# Patient Record
Sex: Male | Born: 1985 | Race: Asian | Hispanic: No | Marital: Single | Smoking: Never smoker
Health system: Southern US, Community
[De-identification: ages and names within clinical notes are randomized; demographics above are authoritative.]

## PROBLEM LIST (undated history)

## (undated) DIAGNOSIS — J45909 Unspecified asthma, uncomplicated: Secondary | ICD-10-CM

---

## 2012-07-03 ENCOUNTER — Emergency Department (HOSPITAL_COMMUNITY)
Admission: EM | Admit: 2012-07-03 | Discharge: 2012-07-03 | Disposition: A | Discharge status: Home / Self Care | Payer: Self-pay | Attending: Emergency Medicine | Admitting: Emergency Medicine

## 2012-07-03 ENCOUNTER — Encounter (HOSPITAL_COMMUNITY): Payer: Self-pay | Admitting: Emergency Medicine

## 2012-07-03 DIAGNOSIS — J45901 Unspecified asthma with (acute) exacerbation: Secondary | ICD-10-CM | POA: Insufficient documentation

## 2012-07-03 DIAGNOSIS — R062 Wheezing: Secondary | ICD-10-CM | POA: Insufficient documentation

## 2012-07-03 DIAGNOSIS — R0602 Shortness of breath: Secondary | ICD-10-CM | POA: Insufficient documentation

## 2012-07-03 HISTORY — DX: Unspecified asthma, uncomplicated: J45.909

## 2012-07-03 MED ORDER — PREDNISONE 20 MG PO TABS: 40.0000 mg | ORAL_TABLET | Freq: Every day | ORAL | Status: DC

## 2012-07-03 MED ORDER — ALBUTEROL SULFATE HFA 108 (90 BASE) MCG/ACT IN AERS: 2.0000 | INHALATION_SPRAY | RESPIRATORY_TRACT | Status: DC | PRN

## 2012-07-03 MED ORDER — ALBUTEROL SULFATE (5 MG/ML) 0.5% IN NEBU
5.0000 mg | INHALATION_SOLUTION | Freq: Once | RESPIRATORY_TRACT | Status: AC
  Administered 2012-07-03: 5 mg via RESPIRATORY_TRACT
  Filled 2012-07-03: qty 1

## 2012-07-03 MED ORDER — PREDNISONE 20 MG PO TABS: 60.0000 mg | ORAL_TABLET | Freq: Once | ORAL | Status: DC

## 2012-07-03 MED ORDER — ALBUTEROL SULFATE HFA 108 (90 BASE) MCG/ACT IN AERS
2.0000 | INHALATION_SPRAY | Freq: Once | RESPIRATORY_TRACT | Status: DC
  Filled 2012-07-03: qty 6.7

## 2012-07-03 NOTE — ED Provider Notes (Signed)
History     CSN: [id number]  Arrival date & time 07/03/12  4098   First MD Initiated Contact with Patient 07/03/12 [phone]      Chief Complaint  Patient presents with  . Asthma    (Consider location/radiation/quality/duration/timing/severity/associated sxs/prior treatment) HPI  Kiko Ripp is a 27 y.o. male with mild intermittent asthma exacerbation worsening over the last several days. Patient's has not taken any other medications he hasn't had it as he essentially grew out of his asthma in childhood. He does have allergies but taking PO allergy medications. He denies fever, cough, nausea vomiting. Patient does have wheezing associated shortness of breath and clear rhinorrhea.   Past Medical History  Diagnosis Date  . Asthma     History reviewed. No pertinent past surgical history.  No family history on file.  History  Substance Use Topics  . Smoking status: Never Smoker   . Smokeless tobacco: Not on file  . Alcohol Use: Yes      Review of Systems  Constitutional: Negative for fever.  Respiratory: Positive for shortness of breath and wheezing.   Cardiovascular: Negative for chest pain.  Gastrointestinal: Negative for nausea, vomiting, abdominal pain and diarrhea.  All other systems reviewed and are negative.    Allergies  Amoxicillin and Penicillins  Home Medications  No current outpatient prescriptions on file.  BP 112/82  Pulse 68  Temp(Src) 97.1 F (36.2 C)  Resp 20  Ht 5\' 7"  (1.702 m)  Wt 160 lb (72.576 kg)  BMI 25.05 kg/m2  SpO2 99%  Physical Exam  Nursing note and vitals reviewed. Constitutional: He is oriented to person, place, and time. He appears well-developed and well-nourished. No distress.  HENT:  Head: Normocephalic.  Eyes: Conjunctivae and EOM are normal.  Cardiovascular: Normal rate.   Pulmonary/Chest: Effort normal. No stridor. No respiratory distress. He has wheezes. He has no rales. He exhibits no tenderness.  Patient  reclining comfortably, speaking in complete sentences. No stridor, no accessory muscle use, good air movement in all fields. There is diffuse expiratory wheezing.  Abdominal: Soft.  Musculoskeletal: Normal range of motion.  Neurological: He is alert and oriented to person, place, and time.  Psychiatric: He has a normal mood and affect.    ED Course  Procedures (including critical care time)  Labs Reviewed - No data to display No results found.   1. Asthma attack       MDM   Filed Vitals:   07/03/12 0837 07/03/12 0856  BP: 112/82   Pulse: 68   Temp: 97.1 F (36.2 C)   Resp: 20   Height: 5\' 7"  (1.702 m)   Weight: 160 lb (72.576 kg)   SpO2: 99% 98%     Jimmy Plessinger is a 27 y.o. male with asthma exacerbation. Nebulizer, chest x-ray and by mouth prednisone ordered.  Patient declines x-ray and prednisone. After nebulizer treatment he states that he feels much better. Lung sounds show reduce wheezing.  Medications  predniSONE (DELTASONE) tablet 60 mg (not administered)  albuterol (PROVENTIL HFA;VENTOLIN HFA) 108 (90 BASE) MCG/ACT inhaler 2 puff (not administered)  albuterol (PROVENTIL) (5 MG/ML) 0.5% nebulizer solution 5 mg (5 mg Nebulization Given 07/03/12 0853)    Pt is hemodynamically stable, appropriate for, and amenable to discharge at this time. Pt verbalized understanding and agrees with care plan. Outpatient follow-up and specific return precautions discussed.    New Prescriptions   ALBUTEROL (PROVENTIL HFA;VENTOLIN HFA) 108 (90 BASE) MCG/ACT INHALER    Inhale 2 puffs  into the lungs every 2 (two) hours as needed for wheezing or shortness of breath (cough).   PREDNISONE (DELTASONE) 20 MG TABLET    Take 2 tablets (40 mg total) by mouth daily.           Wynetta Emery, PA-C 07/03/12 [phone]

## 2012-07-03 NOTE — ED Notes (Addendum)
Patient refused xray.   Patient states "I feel fine.  I don't need one".   Pisciotta, PA advised.

## 2012-07-03 NOTE — ED Notes (Signed)
Patient states that he hasn't had a flare up in years.  Patient states he had been cleaning a lot yesterday for his son's birthday and he thinks he triggered some wheezing.   Patient states that he thinks the humidity is contributing also.   Patient does have some wheezing.  Patient didn't have an inhaler at home.

## 2012-07-04 NOTE — ED Provider Notes (Signed)
Medical screening examination/treatment/procedure(s) were performed by non-physician practitioner and as supervising physician I was immediately available for consultation/collaboration.  Geoffery Lyons, MD 07/04/12 [phone]

## 2012-10-10 ENCOUNTER — Emergency Department (HOSPITAL_COMMUNITY)
Admission: EM | Admit: 2012-10-10 | Discharge: 2012-10-10 | Disposition: A | Discharge status: Home / Self Care | Payer: Self-pay | Attending: Emergency Medicine | Admitting: Emergency Medicine

## 2012-10-10 ENCOUNTER — Encounter (HOSPITAL_COMMUNITY): Payer: Self-pay | Admitting: Emergency Medicine

## 2012-10-10 DIAGNOSIS — J45901 Unspecified asthma with (acute) exacerbation: Secondary | ICD-10-CM | POA: Insufficient documentation

## 2012-10-10 DIAGNOSIS — Z79899 Other long term (current) drug therapy: Secondary | ICD-10-CM | POA: Insufficient documentation

## 2012-10-10 DIAGNOSIS — R0789 Other chest pain: Secondary | ICD-10-CM | POA: Insufficient documentation

## 2012-10-10 MED ORDER — ALBUTEROL SULFATE (5 MG/ML) 0.5% IN NEBU
5.0000 mg | INHALATION_SOLUTION | Freq: Once | RESPIRATORY_TRACT | Status: AC
  Administered 2012-10-10: 5 mg via RESPIRATORY_TRACT
  Filled 2012-10-10: qty 1

## 2012-10-10 MED ORDER — PREDNISONE 20 MG PO TABS: 60.0000 mg | ORAL_TABLET | Freq: Once | ORAL | Status: DC

## 2012-10-10 MED ORDER — IPRATROPIUM BROMIDE 0.02 % IN SOLN
0.5000 mg | Freq: Once | RESPIRATORY_TRACT | Status: DC
  Filled 2012-10-10: qty 2.5

## 2012-10-10 MED ORDER — ALBUTEROL SULFATE (5 MG/ML) 0.5% IN NEBU
5.0000 mg | INHALATION_SOLUTION | Freq: Once | RESPIRATORY_TRACT | Status: DC
  Filled 2012-10-10: qty 1

## 2012-10-10 NOTE — ED Notes (Signed)
Per PT, pt has not been able to sleep all night because his chest has been very tight, pt states he was dx with asthma last year. Pt states he has his inhaler at home to use however he used it up tonight trying to get some relief. Pt states he could not catch his breath and finally decided to seek medical care. Pt tachycardic upon arrival to department and very SOB. Pt started on an albuterol tx as soon as pt was placed in room. Pt is A&O x4. Pt has expiratory wheezing present bilaterally.

## 2012-10-10 NOTE — ED Notes (Signed)
Pt refused breathing tx. Pt states he is ready to do. PA informed. Pt leaving AMA.

## 2012-10-10 NOTE — ED Provider Notes (Signed)
CSN: [id number]     Arrival date & time 10/10/12  7829 History   First MD Initiated Contact with Patient 10/10/12 [phone]     Chief Complaint  Patient presents with  . Asthma   (Consider location/radiation/quality/duration/timing/severity/associated sxs/prior Treatment) HPI Lawrence Gutierrez is a 27 y.o. male who presents emergency department with complaint of shortness of breath. Patient states his shortness of breath started last night when he was going to bed. States he did not take his albuterol because he ran out. States in the last month, has had to use his inhaler daily. States used to be on other inhalers however, does not have a PCP now and not following up. States his breathing is always worse at night time. States he believes it is due to some mold in his house.  Pt received a neb treatment prior to me seeing him. He states he feels much better.      Past Medical History  Diagnosis Date  . Asthma    History reviewed. No pertinent past surgical history. No family history on file. History  Substance Use Topics  . Smoking status: Never Smoker   . Smokeless tobacco: Not on file  . Alcohol Use: Yes    Review of Systems  Constitutional: Negative for fever and chills.  Respiratory: Positive for chest tightness, shortness of breath and wheezing. Negative for cough.   Neurological: Negative for weakness and numbness.  All other systems reviewed and are negative.    Allergies  Amoxicillin and Penicillins  Home Medications   Current Outpatient Rx  Name  Route  Sig  Dispense  Refill  . albuterol (PROVENTIL HFA;VENTOLIN HFA) 108 (90 BASE) MCG/ACT inhaler   Inhalation   Inhale 2 puffs into the lungs every 2 (two) hours as needed for wheezing or shortness of breath (cough).   1 Inhaler   2    BP 117/91  Pulse 102  Temp(Src) 97.6 F (36.4 C) (Oral)  SpO2 93% Physical Exam  Nursing note and vitals reviewed. Constitutional: He is oriented to person, place, and time. He  appears well-developed and well-nourished. No distress.  HENT:  Head: Normocephalic and atraumatic.  Right Ear: External ear normal.  Left Ear: External ear normal.  Mouth/Throat: Oropharynx is clear and moist.  Clear rhinorrhea, pharynx erythemous, uvula midline  Eyes: Conjunctivae are normal.  Neck: Normal range of motion. Neck supple.  No meningeal signs  Cardiovascular: Normal rate, regular rhythm and normal heart sounds.   Pulmonary/Chest: Effort normal and breath sounds normal. No respiratory distress. He has no wheezes. He has no rales.  Decreased air movement bilaterally  Abdominal: Soft. Bowel sounds are normal. There is no tenderness.  Musculoskeletal: He exhibits no edema and no tenderness.  Lymphadenopathy:    He has no cervical adenopathy.  Neurological: He is alert and oriented to person, place, and time.  Skin: Skin is warm and dry. No erythema.  Psychiatric: He has a normal mood and affect.    ED Course  Procedures (including critical care time) Labs Review Labs Reviewed - No data to display Imaging Review No results found.  MDM   1. Asthma exacerbation     PT seen and examined. Pt with asthma exacerbation. According to RN, was wheezing on initial exam and having difficulty breathing, was given albuterol neb. On my exam, no wheezing heard. He is speaking in full sentences. He is in no distress. Oxygen sat at 93%. Will try another neb with atrovent, and 60mg  of prednisone ordered. Pt  has not had any fever, chill, chest pain. Do not think labs or imaging indicated at this time.   7:28 AM Pt refusing to wait on his 2nd treatment or prednisone. Stated "Lawrence Gutierrez are taking too long, I have things to do, I am fine." Pt will be discharged AMA.  PT states he still has his prescriptions from when he was here 1 months ago for an inhaler and prednisone   Filed Vitals:   10/10/12 0618 10/10/12 0715  BP: 117/91   Pulse: 102 107  Temp: 97.6 F (36.4 C)   TempSrc: Oral    SpO2: 93% 96%      Lottie Mussel, PA-C 10/10/12 1536

## 2012-10-10 NOTE — ED Notes (Signed)
Pt left AMA °

## 2012-10-10 NOTE — ED Notes (Signed)
PA at bedside.

## 2012-10-11 NOTE — ED Provider Notes (Signed)
Medical screening examination/treatment/procedure(s) were performed by non-physician practitioner and as supervising physician I was immediately available for consultation/collaboration.   Kearston Putman David Arlin Sass, MD 10/11/12 0243 

## 2013-09-11 ENCOUNTER — Encounter (HOSPITAL_COMMUNITY): Payer: Self-pay | Admitting: Emergency Medicine

## 2013-09-11 ENCOUNTER — Emergency Department (HOSPITAL_COMMUNITY)
Admission: EM | Admit: 2013-09-11 | Discharge: 2013-09-12 | Disposition: A | Discharge status: Home / Self Care | Payer: Self-pay | Attending: Emergency Medicine | Admitting: Emergency Medicine

## 2013-09-11 DIAGNOSIS — R42 Dizziness and giddiness: Secondary | ICD-10-CM | POA: Insufficient documentation

## 2013-09-11 DIAGNOSIS — Z880 Allergy status to penicillin: Secondary | ICD-10-CM | POA: Insufficient documentation

## 2013-09-11 DIAGNOSIS — Z79899 Other long term (current) drug therapy: Secondary | ICD-10-CM | POA: Insufficient documentation

## 2013-09-11 DIAGNOSIS — J45909 Unspecified asthma, uncomplicated: Secondary | ICD-10-CM | POA: Insufficient documentation

## 2013-09-11 DIAGNOSIS — G44209 Tension-type headache, unspecified, not intractable: Secondary | ICD-10-CM | POA: Insufficient documentation

## 2013-09-11 DIAGNOSIS — R51 Headache: Secondary | ICD-10-CM | POA: Insufficient documentation

## 2013-09-11 MED ORDER — KETOROLAC TROMETHAMINE 60 MG/2ML IM SOLN
60.0000 mg | Freq: Once | INTRAMUSCULAR | Status: AC
  Administered 2013-09-11: 60 mg via INTRAMUSCULAR
  Filled 2013-09-11: qty 2

## 2013-09-11 MED ORDER — DIPHENHYDRAMINE HCL 25 MG PO CAPS
50.0000 mg | ORAL_CAPSULE | Freq: Once | ORAL | Status: AC
  Administered 2013-09-11: 50 mg via ORAL
  Filled 2013-09-11: qty 2

## 2013-09-11 MED ORDER — METOCLOPRAMIDE HCL 10 MG PO TABS
10.0000 mg | ORAL_TABLET | Freq: Once | ORAL | Status: AC
  Administered 2013-09-11: 10 mg via ORAL
  Filled 2013-09-11: qty 1

## 2013-09-11 NOTE — ED Notes (Addendum)
presents with 4 days of intermittent headaches-uncontrolled with motrin at home.  Movement makes headaches worse and causes slight dizziness and nasuea. Denies sensitivity to light and sound, denies rashes, denis vomtiing. Also c/o bodyaches, denies fevers. Afebrile here. Bilateral breath sounds clear, alert,. Oriented,, MAEx4., neurologically intact

## 2013-09-11 NOTE — ED Provider Notes (Signed)
CSN: [id number]     Arrival date & time 09/11/13  1654 History   First MD Initiated Contact with Patient 09/11/13 2125     Chief Complaint  Patient presents with  . Headache     (Consider location/radiation/quality/duration/timing/severity/associated sxs/prior Treatment) HPI Patient is a 28 year old male who presents to the ED tonight complaining of headache. Patient states his headache began gradually 4 days ago, when he woke up. Patient states his headache is a "lingering, constant, annoying" pain and he has some mild nausea associated with it with no vomiting. Patient reports if he "thinks about something nice" it helps alleviate his headache. However if he thinks about his problems, his headache comes back. He reports no other alleviating or aggravating factors. He states he has attempted taking 200 mg of ibuprofen 4 times a day for his pain. 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 Patient denies associated photophobia, phonophobia, visual disturbance, blurred vision, dizziness, weakness, back pain, neck pain, fever  Past Medical History  Diagnosis Date  . Asthma    History reviewed. No pertinent past surgical history. History reviewed. No pertinent family history. History  Substance Use Topics  . Smoking status: Never Smoker   . Smokeless tobacco: Not on file  . Alcohol Use: Yes    Review of Systems  Constitutional: Negative for fever.  HENT: Negative for congestion, dental problem, hearing loss, postnasal drip, rhinorrhea, sore throat and trouble swallowing.   Eyes: Negative for photophobia, pain and visual disturbance.  Respiratory: Negative for shortness of breath.   Cardiovascular: Negative for chest pain.  Gastrointestinal: Negative for nausea, vomiting and abdominal pain.  Genitourinary: Negative for dysuria.  Musculoskeletal: Negative for arthralgias, back pain, gait problem,  neck pain and neck stiffness.  Skin: Negative for rash.  Neurological: Negative for dizziness, weakness and numbness.  Psychiatric/Behavioral: Negative.       Allergies  Amoxicillin and Penicillins  Home Medications   Prior to Admission medications   Medication Sig Start Date End Date Taking? Authorizing Provider  acetaminophen (TYLENOL) 500 MG tablet Take 500 mg by mouth every 6 (six) hours as needed for headache.   Yes Historical Provider, MD  albuterol (PROVENTIL HFA;VENTOLIN HFA) 108 (90 BASE) MCG/ACT inhaler Inhale 2 puffs into the lungs every 2 (two) hours as needed for wheezing or shortness of breath (cough). 07/03/12  Yes Nicole Pisciotta, PA-C  cyclobenzaprine (FLEXERIL) 5 MG tablet Take 5 mg by mouth 3 (three) times daily as needed for muscle spasms.   Yes Historical Provider, MD  ibuprofen (ADVIL,MOTRIN) 200 MG tablet Take 200 mg by mouth every 6 (six) hours as needed for headache.   Yes Historical Provider, MD  ibuprofen (ADVIL,MOTRIN) 800 MG tablet Take 1 tablet (800 mg total) by mouth 3 (three) times daily. 09/12/13   Monte Fantasia, PA-C   BP 114/82  Pulse 83  Temp(Src) 98.2 F (36.8 C) (Oral)  Resp 18  SpO2 96% Physical Exam  Nursing note and vitals reviewed. Constitutional: He is oriented to person, place, and time. He appears well-developed and well-nourished. No distress.  HENT:  Head: Normocephalic and atraumatic.  Mouth/Throat: Oropharynx is clear and moist. No oropharyngeal exudate.  Eyes: Right eye exhibits no discharge. Left eye exhibits no discharge. No scleral icterus.  Neck: Normal range of motion.  Cardiovascular: Normal rate, regular rhythm and normal heart sounds.   No murmur heard. Pulmonary/Chest: Effort normal and  breath sounds normal. No respiratory distress.  Abdominal: Soft. There is no tenderness.  Musculoskeletal: Normal range of motion. He exhibits no edema and no tenderness.  Neurological: He is alert and oriented to person, place, and  time. He has normal strength and normal reflexes. No cranial nerve deficit or sensory deficit. Coordination and gait normal. GCS eye subscore is 4. GCS verbal subscore is 5. GCS motor subscore is 6.  Skin: Skin is warm and dry. No rash noted. He is not diaphoretic.  Psychiatric: He has a normal mood and affect.    ED Course  Procedures (including critical care time) Labs Review Labs Reviewed - No data to display  Imaging Review No results found.   EKG Interpretation None      MDM   Final diagnoses:  Tension headache    27 year old male with past medical history of asthma presenting with 4 days of headache. Patient's description of his headache "annoying, lingering headache" which is alleviated by thinking about something nice, and made worse when he thinks about his problems. Patient's neuro exam benign, patient appears nontoxic, no obvious focal neurologic deficit or meningeal signs. Headache inconsistent with history of thunderclap headache, and it is not consistent with clinical presentation of a migraine. At this time we will attempt headache cocktail of Toradol, Reglan, Benadryl to address patient's headache and nausea.   12:15 AM:  Patient states his pain is gone from a 6/10- to a 3/10. Patient states his headache is still "nagging and annoying". We will try a by mouth hydrocodone. Patient states he feels like he may need his inhaler, and states his inhaler at home has run out. We will provide him with one here.  12:57 AM: Patient is sleeping during, with patient patient says he is "feeling much better"and is ready to go. We'll discharge patient with prescription for Motrin and have him follow up with primary care. We encouraged patient to call or return to the ER should he have any questions or concerns.  Filed Vitals:   09/12/13 0100  BP: 114/82  Pulse: 83  Temp: 98.2 F (36.8 C)  Resp: 18     Signed,  Ladona Mow, PA-C 1:46 AM   This patient seen and discussed  with Dr. Raeford Razor, M.D.  Monte Fantasia, PA-C 09/12/13 [phone]

## 2013-09-12 MED ORDER — HYDROCODONE-ACETAMINOPHEN 5-325 MG PO TABS
1.0000 | ORAL_TABLET | Freq: Once | ORAL | Status: AC
  Administered 2013-09-12: 1 via ORAL
  Filled 2013-09-12: qty 1

## 2013-09-12 MED ORDER — ALBUTEROL SULFATE HFA 108 (90 BASE) MCG/ACT IN AERS
2.0000 | INHALATION_SPRAY | Freq: Once | RESPIRATORY_TRACT | Status: AC
  Administered 2013-09-12: 2 via RESPIRATORY_TRACT
  Filled 2013-09-12: qty 6.7

## 2013-09-12 MED ORDER — IBUPROFEN 800 MG PO TABS
800.0000 mg | ORAL_TABLET | Freq: Three times a day (TID) | ORAL | Status: DC
Start: 2013-09-12 — End: 2014-12-27

## 2013-09-12 NOTE — Discharge Instructions (Signed)

## 2013-09-15 NOTE — ED Provider Notes (Signed)
Medical screening examination/treatment/procedure(s) were performed by non-physician practitioner and as supervising physician I was immediately available for consultation/collaboration.   EKG Interpretation None       Raeford Razor, MD 09/15/13 [phone]

## 2013-11-16 ENCOUNTER — Emergency Department (HOSPITAL_COMMUNITY)
Admission: EM | Admit: 2013-11-16 | Discharge: 2013-11-16 | Disposition: A | Discharge status: Home / Self Care | Payer: Self-pay | Attending: Emergency Medicine | Admitting: Emergency Medicine

## 2013-11-16 ENCOUNTER — Encounter (HOSPITAL_COMMUNITY): Payer: Self-pay | Admitting: *Deleted

## 2013-11-16 DIAGNOSIS — Z880 Allergy status to penicillin: Secondary | ICD-10-CM | POA: Insufficient documentation

## 2013-11-16 DIAGNOSIS — J4541 Moderate persistent asthma with (acute) exacerbation: Secondary | ICD-10-CM | POA: Insufficient documentation

## 2013-11-16 DIAGNOSIS — Z791 Long term (current) use of non-steroidal anti-inflammatories (NSAID): Secondary | ICD-10-CM | POA: Insufficient documentation

## 2013-11-16 DIAGNOSIS — Z79899 Other long term (current) drug therapy: Secondary | ICD-10-CM | POA: Insufficient documentation

## 2013-11-16 MED ORDER — PREDNISONE 20 MG PO TABS
60.0000 mg | ORAL_TABLET | Freq: Once | ORAL | Status: AC
  Administered 2013-11-16: 60 mg via ORAL
  Filled 2013-11-16: qty 3

## 2013-11-16 MED ORDER — IPRATROPIUM-ALBUTEROL 0.5-2.5 (3) MG/3ML IN SOLN: 3.0000 mL | Freq: Once | RESPIRATORY_TRACT | Status: AC

## 2013-11-16 MED ORDER — ALBUTEROL SULFATE (2.5 MG/3ML) 0.083% IN NEBU
5.0000 mg | INHALATION_SOLUTION | Freq: Once | RESPIRATORY_TRACT | Status: AC
  Administered 2013-11-16: 5 mg via RESPIRATORY_TRACT
  Filled 2013-11-16: qty 6

## 2013-11-16 MED ORDER — ALBUTEROL SULFATE HFA 108 (90 BASE) MCG/ACT IN AERS: 2.0000 | INHALATION_SPRAY | Freq: Four times a day (QID) | RESPIRATORY_TRACT | Status: DC | PRN

## 2013-11-16 MED ORDER — PREDNISONE 50 MG PO TABS
50.0000 mg | ORAL_TABLET | Freq: Every day | ORAL | Status: DC
Start: 2013-11-16 — End: 2014-02-23

## 2013-11-16 MED ORDER — IPRATROPIUM-ALBUTEROL 0.5-2.5 (3) MG/3ML IN SOLN
RESPIRATORY_TRACT | Status: AC
  Administered 2013-11-16: 3 mL via RESPIRATORY_TRACT
  Filled 2013-11-16: qty 3

## 2013-11-16 NOTE — Discharge Instructions (Signed)
We saw you in the ER for your asthma related complains. We gave you some breathing treatments in the ER, and seems like your symptoms have improved. Please take albuterol as needed every 4 hours. Please take the medications prescribed. Please refrain from smoking or smoke exposure. Please see a primary care doctor in 1 week. Return to the ER if your symptoms worsen.   Asthma Asthma is a recurring condition in which the airways tighten and narrow. Asthma can make it difficult to breathe. It can cause coughing, wheezing, and shortness of breath. Asthma episodes, also called asthma attacks, range from minor to life-threatening. Asthma cannot be cured, but medicines and lifestyle changes can help control it. CAUSES Asthma is believed to be caused by inherited (genetic) and environmental factors, but its exact cause is unknown. Asthma may be triggered by allergens, lung infections, or irritants in the air. Asthma triggers are different for each person. Common triggers include:   Animal dander.  Dust mites.  Cockroaches.  Pollen from trees or grass.  Mold.  Smoke.  Air pollutants such as dust, household cleaners, hair sprays, aerosol sprays, paint fumes, strong chemicals, or strong odors.  Cold air, weather changes, and winds (which increase molds and pollens in the air).  Strong emotional expressions such as crying or laughing hard.  Stress.  Certain medicines (such as aspirin) or types of drugs (such as beta-blockers).  Sulfites in foods and drinks. Foods and drinks that may contain sulfites include dried fruit, potato chips, and sparkling grape juice.  Infections or inflammatory conditions such as the flu, a cold, or an inflammation of the nasal membranes (rhinitis).  Gastroesophageal reflux disease (GERD).  Exercise or strenuous activity. SYMPTOMS Symptoms may occur immediately after asthma is triggered or many hours later. Symptoms include:  Wheezing.  Excessive  nighttime or early morning coughing.  Frequent or severe coughing with a common cold.  Chest tightness.  Shortness of breath. DIAGNOSIS  The diagnosis of asthma is made by a review of your medical history and a physical exam. Tests may also be performed. These may include:  Lung function studies. These tests show how much air you breathe in and out.  Allergy tests.  Imaging tests such as X-rays. TREATMENT  Asthma cannot be cured, but it can usually be controlled. Treatment involves identifying and avoiding your asthma triggers. It also involves medicines. There are 2 classes of medicine used for asthma treatment:   Controller medicines. These prevent asthma symptoms from occurring. They are usually taken every day.  Reliever or rescue medicines. These quickly relieve asthma symptoms. They are used as needed and provide short-term relief. Your health care provider will help you create an asthma action plan. An asthma action plan is a written plan for managing and treating your asthma attacks. It includes a list of your asthma triggers and how they may be avoided. It also includes information on when medicines should be taken and when their dosage should be changed. An action plan may also involve the use of a device called a peak flow meter. A peak flow meter measures how well the lungs are working. It helps you monitor your condition. HOME CARE INSTRUCTIONS   Take medicines only as directed by your health care provider. Speak with your health care provider if you have questions about how or when to take the medicines.  Use a peak flow meter as directed by your health care provider. Record and keep track of readings.  Understand and use the action  plan to help minimize or stop an asthma attack without needing to seek medical care.  Control your home environment in the following ways to help prevent asthma attacks:  Do not smoke. Avoid being exposed to secondhand smoke.  Change your  heating and air conditioning filter regularly.  Limit your use of fireplaces and wood stoves.  Get rid of pests (such as roaches and mice) and their droppings.  Throw away plants if you see mold on them.  Clean your floors and dust regularly. Use unscented cleaning products.  Try to have someone else vacuum for you regularly. Stay out of rooms while they are being vacuumed and for a short while afterward. If you vacuum, use a dust mask from a hardware store, a double-layered or microfilter vacuum cleaner bag, or a vacuum cleaner with a HEPA filter.  Replace carpet with wood, tile, or vinyl flooring. Carpet can trap dander and dust.  Use allergy-proof pillows, mattress covers, and box spring covers.  Wash bed sheets and blankets every week in hot water and dry them in a dryer.  Use blankets that are made of polyester or cotton.  Clean bathrooms and kitchens with bleach. If possible, have someone repaint the walls in these rooms with mold-resistant paint. Keep out of the rooms that are being cleaned and painted.  Wash hands frequently. SEEK MEDICAL CARE IF:   You have wheezing, shortness of breath, or a cough even if taking medicine to prevent attacks.  The colored mucus you cough up (sputum) is thicker than usual.  Your sputum changes from clear or white to yellow, green, gray, or bloody.  You have any problems that may be related to the medicines you are taking (such as a rash, itching, swelling, or trouble breathing).  You are using a reliever medicine more than 2-3 times per week.  Your peak flow is still at 50-79% of your personal best after following your action plan for 1 hour.  You have a fever. SEEK IMMEDIATE MEDICAL CARE IF:   You seem to be getting worse and are unresponsive to treatment during an asthma attack.  You are short of breath even at rest.  You get short of breath when doing very little physical activity.  You have difficulty eating, drinking, or  talking due to asthma symptoms.  You develop chest pain.  You develop a fast heartbeat.  You have a bluish color to your lips or fingernails.  You are light-headed, dizzy, or faint.  Your peak flow is less than 50% of your personal best. MAKE SURE YOU:   Understand these instructions.  Will watch your condition.  Will get help right away if you are not doing well or get worse. Document Released: 01/01/2005 Document Revised: 05/18/2013 Document Reviewed: 07/31/2012 [facility] Patient Information 2015 [place], [place]. This information is not intended to replace advice given to you by your health care provider. Make sure you discuss any questions you have with your health care provider.  Asthma Attack Prevention Although there is no way to prevent asthma from starting, you can take steps to control the disease and reduce its symptoms. Learn about your asthma and how to control it. Take an active role to control your asthma by working with your health care provider to create and follow an asthma action plan. An asthma action plan guides you in:  Taking your medicines properly.  Avoiding things that set off your asthma or make your asthma worse (asthma triggers).  Tracking your level of asthma  control.  Responding to worsening asthma.  Seeking emergency care when needed. To track your asthma, keep records of your symptoms, check your peak flow number using a handheld device that shows how well air moves out of your lungs (peak flow meter), and get regular asthma checkups.  WHAT ARE SOME WAYS TO PREVENT AN ASTHMA ATTACK?  Take medicines as directed by your health care provider.  Keep track of your asthma symptoms and level of control.  With your health care provider, write a detailed plan for taking medicines and managing an asthma attack. Then be sure to follow your action plan. Asthma is an ongoing condition that needs regular monitoring and treatment.  Identify and avoid asthma  triggers. Many outdoor allergens and irritants (such as pollen, mold, cold air, and air pollution) can trigger asthma attacks. Find out what your asthma triggers are and take steps to avoid them.  Monitor your breathing. Learn to recognize warning signs of an attack, such as coughing, wheezing, or shortness of breath. Your lung function may decrease before you notice any signs or symptoms, so regularly measure and record your peak airflow with a home peak flow meter.  Identify and treat attacks early. If you act quickly, you are less likely to have a severe attack. You will also need less medicine to control your symptoms. When your peak flow measurements decrease and alert you to an upcoming attack, take your medicine as instructed and immediately stop any activity that may have triggered the attack. If your symptoms do not improve, get medical help.  Pay attention to increasing quick-relief inhaler use. If you find yourself relying on your quick-relief inhaler, your asthma is not under control. See your health care provider about adjusting your treatment. WHAT CAN MAKE MY SYMPTOMS WORSE? A number of common things can set off or make your asthma symptoms worse and cause temporary increased inflammation of your airways. Keep track of your asthma symptoms for several weeks, detailing all the environmental and emotional factors that are linked with your asthma. When you have an asthma attack, go back to your asthma diary to see which factor, or combination of factors, might have contributed to it. Once you know what these factors are, you can take steps to control many of them. If you have allergies and asthma, it is important to take asthma prevention steps at home. Minimizing contact with the substance to which you are allergic will help prevent an asthma attack. Some triggers and ways to avoid these triggers are: Animal Dander:  Some people are allergic to the flakes of skin or dried saliva from animals  with fur or feathers.   There is no such thing as a hypoallergenic dog or cat breed. All dogs or cats can cause allergies, even if they don't shed.  Keep these pets out of your home.  If you are not able to keep a pet outdoors, keep the pet out of your bedroom and other sleeping areas at all times, and keep the door closed.  Remove carpets and furniture covered with cloth from your home. If that is not possible, keep the pet away from fabric-covered furniture and carpets. Dust Mites: Many people with asthma are allergic to dust mites. Dust mites are tiny bugs that are found in every home in mattresses, pillows, carpets, fabric-covered furniture, bedcovers, clothes, stuffed toys, and other fabric-covered items.   Cover your mattress in a special dust-proof cover.  Cover your pillow in a special dust-proof cover, or wash the  pillow each week in hot water. Water must be hotter than 130 F (54.4 C) to kill dust mites. Cold or warm water used with detergent and bleach can also be effective.  Wash the sheets and blankets on your bed each week in hot water.  Try not to sleep or lie on cloth-covered cushions.  Call ahead when traveling and ask for a smoke-free hotel room. Bring your own bedding and pillows in case the hotel only supplies feather pillows and down comforters, which may contain dust mites and cause asthma symptoms.  Remove carpets from your bedroom and those laid on concrete, if you can.  Keep stuffed toys out of the bed, or wash the toys weekly in hot water or cooler water with detergent and bleach. Cockroaches: Many people with asthma are allergic to the droppings and remains of cockroaches.   Keep food and garbage in closed containers. Never leave food out.  Use poison baits, traps, powders, gels, or paste (for example, boric acid).  If a spray is used to kill cockroaches, stay out of the room until the odor goes away. Indoor Mold:  Fix leaky faucets, pipes, or other  sources of water that have mold around them.  Clean floors and moldy surfaces with a fungicide or diluted bleach.  Avoid using humidifiers, vaporizers, or swamp coolers. These can spread molds through the air. Pollen and Outdoor Mold:  When pollen or mold spore counts are high, try to keep your windows closed.  Stay indoors with windows closed from late morning to afternoon. Pollen and some mold spore counts are highest at that time.  Ask your health care provider whether you need to take anti-inflammatory medicine or increase your dose of the medicine before your allergy season starts. Other Irritants to Avoid:  Tobacco smoke is an irritant. If you smoke, ask your health care provider how you can quit. Ask family members to quit smoking, too. Do not allow smoking in your home or car.  If possible, do not use a wood-burning stove, kerosene heater, or fireplace. Minimize exposure to all sources of smoke, including incense, candles, fires, and fireworks.  Try to stay away from strong odors and sprays, such as perfume, talcum powder, hair spray, and paints.  Decrease humidity in your home and use an indoor air cleaning device. Reduce indoor humidity to below 60%. Dehumidifiers or central air conditioners can do this.  Decrease house dust exposure by changing furnace and air cooler filters frequently.  Try to have someone else vacuum for you once or twice a week. Stay out of rooms while they are being vacuumed and for a short while afterward.  If you vacuum, use a dust mask from a hardware store, a double-layered or microfilter vacuum cleaner bag, or a vacuum cleaner with a HEPA filter.  Sulfites in foods and beverages can be irritants. Do not drink beer or wine or eat dried fruit, processed potatoes, or shrimp if they cause asthma symptoms.  Cold air can trigger an asthma attack. Cover your nose and mouth with a scarf on cold or windy days.  Several health conditions can make asthma more  difficult to manage, including a runny nose, sinus infections, reflux disease, psychological stress, and sleep apnea. Work with your health care provider to manage these conditions.  Avoid close contact with people who have a respiratory infection such as a cold or the flu, since your asthma symptoms may get worse if you catch the infection. Wash your hands thoroughly after touching  items that may have been handled by people with a respiratory infection.  Get a flu shot every year to protect against the flu virus, which often makes asthma worse for days or weeks. Also get a pneumonia shot if you have not previously had one. Unlike the flu shot, the pneumonia shot does not need to be given yearly. Medicines:  Talk to your health care provider about whether it is safe for you to take aspirin or non-steroidal anti-inflammatory medicines (NSAIDs). In a small number of people with asthma, aspirin and NSAIDs can cause asthma attacks. These medicines must be avoided by people who have known aspirin-sensitive asthma. It is important that people with aspirin-sensitive asthma read labels of all over-the-counter medicines used to treat pain, colds, coughs, and fever.  Beta-blockers and ACE inhibitors are other medicines you should discuss with your health care provider. HOW CAN I FIND OUT WHAT I AM ALLERGIC TO? Ask your asthma health care provider about allergy skin testing or blood testing (the RAST test) to identify the allergens to which you are sensitive. If you are found to have allergies, the most important thing to do is to try to avoid exposure to any allergens that you are sensitive to as much as possible. Other treatments for allergies, such as medicines and allergy shots (immunotherapy) are available.  CAN I EXERCISE? Follow your health care provider's advice regarding asthma treatment before exercising. It is important to maintain a regular exercise program, but vigorous exercise or exercise in cold,  humid, or dry environments can cause asthma attacks, especially for those people who have exercise-induced asthma. Document Released: 12/20/2008 Document Revised: 01/06/2013 Document Reviewed: 07/09/2012 [facility] Patient Information 2015 [place]. This information is not intended to replace advice given to you by your health care provider. Make sure you discuss any questions you have with your health care provider.

## 2013-11-16 NOTE — ED Notes (Signed)
Pt reports having an asthma attack starting around 2100 last night. Pt has run out of his rescue inhaler.

## 2013-11-16 NOTE — ED Notes (Signed)
MD at bedside. 

## 2013-11-17 NOTE — ED Provider Notes (Signed)
CSN: [id number]     Arrival date & time 11/16/13  0410 History   First MD Initiated Contact with Patient 11/16/13 0420     Chief Complaint  Patient presents with  . Asthma     (Consider location/radiation/quality/duration/timing/severity/associated sxs/prior Treatment) HPI Comments: SUBJECTIVE:  Lawrence Gutierrez is a 28 y.o. male seen urgently with exacerbation of asthma for 1 days. Wheezing is described as moderate to severe. Associated symptoms:congestion. Current asthma medications: none. Patient denies smoke cigarettes. \  Patient is a 28 y.o. male presenting with asthma. The history is provided by the patient.  Asthma Associated symptoms include shortness of breath. Pertinent negatives include no chest pain and no abdominal pain.    Past Medical History  Diagnosis Date  . Asthma    History reviewed. No pertinent past surgical history. History reviewed. No pertinent family history. History  Substance Use Topics  . Smoking status: Never Smoker   . Smokeless tobacco: Not on file  . Alcohol Use: Yes    Review of Systems  Constitutional: Negative for activity change and appetite change.  Respiratory: Positive for chest tightness, shortness of breath and wheezing. Negative for cough.   Cardiovascular: Negative for chest pain.  Gastrointestinal: Negative for abdominal pain.  Genitourinary: Negative for dysuria.      Allergies  Amoxicillin and Penicillins  Home Medications   Prior to Admission medications   Medication Sig Start Date End Date Taking? Authorizing Provider  acetaminophen (TYLENOL) 500 MG tablet Take 500 mg by mouth every 6 (six) hours as needed for headache.   Yes Historical Provider, MD  albuterol (PROVENTIL HFA;VENTOLIN HFA) 108 (90 BASE) MCG/ACT inhaler Inhale 2 puffs into the lungs every 2 (two) hours as needed for wheezing or shortness of breath (cough). 07/03/12  Yes Nicole Pisciotta, PA-C  ibuprofen (ADVIL,MOTRIN) 200 MG tablet Take 200 mg by  mouth every 6 (six) hours as needed for headache.   Yes Historical Provider, MD  albuterol (PROVENTIL HFA;VENTOLIN HFA) 108 (90 BASE) MCG/ACT inhaler Inhale 2 puffs into the lungs every 6 (six) hours as needed for wheezing or shortness of breath. 11/16/13   Derwood KaplanAnkit Esteban Kobashigawa, MD  cyclobenzaprine (FLEXERIL) 5 MG tablet Take 5 mg by mouth 3 (three) times daily as needed for muscle spasms.    Historical Provider, MD  ibuprofen (ADVIL,MOTRIN) 800 MG tablet Take 1 tablet (800 mg total) by mouth 3 (three) times daily. 09/12/13   Monte FantasiaJoseph W Mintz, PA-C  predniSONE (DELTASONE) 50 MG tablet Take 1 tablet (50 mg total) by mouth daily. 11/16/13   Benicia Bergevin Rhunette CroftNanavati, MD   BP 115/71 mmHg  Pulse 52  Temp(Src) 98 F (36.7 C) (Oral)  Resp 16  Ht 5\' 6"  (1.676 m)  Wt 153 lb (69.4 kg)  BMI 24.71 kg/m2  SpO2 100% Physical Exam  Constitutional: He is oriented to person, place, and time. He appears well-developed.  HENT:  Head: Normocephalic and atraumatic.  Eyes: Conjunctivae and EOM are normal. Pupils are equal, round, and reactive to light.  Neck: Normal range of motion. Neck supple.  Cardiovascular: Normal rate and regular rhythm.   Pulmonary/Chest: Effort normal. He has wheezes.  Abdominal: Soft. Bowel sounds are normal. He exhibits no distension. There is no tenderness. There is no rebound and no guarding.  Neurological: He is alert and oriented to person, place, and time.  Skin: Skin is warm.  Nursing note and vitals reviewed.   ED Course  Procedures (including critical care time) Labs Review Labs Reviewed - No data to display  Imaging Review No results found.   EKG Interpretation None      MDM   Final diagnoses:  Acute asthma exacerbation, moderate persistent    Pt comes in with acute asthma exacerbation. Out of meds. Responded well in the ER to bronchodilators, and is stable for d/c. Rx written.  Derwood KaplanAnkit Merril Nagy, MD 11/17/13 [phone]

## 2014-02-23 ENCOUNTER — Encounter (HOSPITAL_COMMUNITY): Payer: Self-pay | Admitting: Emergency Medicine

## 2014-02-23 ENCOUNTER — Emergency Department (HOSPITAL_COMMUNITY)
Admission: EM | Admit: 2014-02-23 | Discharge: 2014-02-23 | Disposition: A | Discharge status: Home / Self Care | Payer: Self-pay | Attending: Emergency Medicine | Admitting: Emergency Medicine

## 2014-02-23 DIAGNOSIS — R000 Tachycardia, unspecified: Secondary | ICD-10-CM | POA: Insufficient documentation

## 2014-02-23 DIAGNOSIS — Z7952 Long term (current) use of systemic steroids: Secondary | ICD-10-CM | POA: Insufficient documentation

## 2014-02-23 DIAGNOSIS — Z880 Allergy status to penicillin: Secondary | ICD-10-CM | POA: Insufficient documentation

## 2014-02-23 DIAGNOSIS — Z79899 Other long term (current) drug therapy: Secondary | ICD-10-CM | POA: Insufficient documentation

## 2014-02-23 DIAGNOSIS — J45901 Unspecified asthma with (acute) exacerbation: Secondary | ICD-10-CM | POA: Insufficient documentation

## 2014-02-23 MED ORDER — ALBUTEROL SULFATE HFA 108 (90 BASE) MCG/ACT IN AERS: 2.0000 | INHALATION_SPRAY | RESPIRATORY_TRACT | Status: DC | PRN

## 2014-02-23 MED ORDER — ALBUTEROL SULFATE HFA 108 (90 BASE) MCG/ACT IN AERS
2.0000 | INHALATION_SPRAY | Freq: Once | RESPIRATORY_TRACT | Status: AC
  Administered 2014-02-23: 2 via RESPIRATORY_TRACT
  Filled 2014-02-23: qty 6.7

## 2014-02-23 MED ORDER — PREDNISONE 20 MG PO TABS
60.0000 mg | ORAL_TABLET | Freq: Once | ORAL | Status: AC
  Administered 2014-02-23: 60 mg via ORAL
  Filled 2014-02-23: qty 3

## 2014-02-23 MED ORDER — ALBUTEROL SULFATE (2.5 MG/3ML) 0.083% IN NEBU
5.0000 mg | INHALATION_SOLUTION | Freq: Once | RESPIRATORY_TRACT | Status: AC
  Administered 2014-02-23: 5 mg via RESPIRATORY_TRACT
  Filled 2014-02-23: qty 6

## 2014-02-23 MED ORDER — IPRATROPIUM-ALBUTEROL 0.5-2.5 (3) MG/3ML IN SOLN
3.0000 mL | Freq: Once | RESPIRATORY_TRACT | Status: AC
  Administered 2014-02-23: 3 mL via RESPIRATORY_TRACT

## 2014-02-23 MED ORDER — IPRATROPIUM-ALBUTEROL 0.5-2.5 (3) MG/3ML IN SOLN
RESPIRATORY_TRACT | Status: AC
  Filled 2014-02-23: qty 3

## 2014-02-23 MED ORDER — PREDNISONE 50 MG PO TABS: ORAL_TABLET | ORAL | Status: DC

## 2014-02-23 NOTE — ED Provider Notes (Signed)
CSN: [id number]     Arrival date & time 02/23/14  1351 History   First MD Initiated Contact with Patient 02/23/14 1448     Chief Complaint  Patient presents with  . Asthma     (Consider location/radiation/quality/duration/timing/severity/associated sxs/prior Treatment) HPI   Lawrence Gutierrez is a 29 y.o. male complaining of chest tightness and bronchospasm with wheezing onset 2 days ago. Patient states he's been out of his albuterol inhaler for over a week. He denies cough, fever. States that he thinks his asthma set off by both allergies and patient was cleaning the house yesterday and thinks it may have kicked up a lot of dirt into the air.  Past Medical History  Diagnosis Date  . Asthma    History reviewed. No pertinent past surgical history. History reviewed. No pertinent family history. History  Substance Use Topics  . Smoking status: Never Smoker   . Smokeless tobacco: Not on file  . Alcohol Use: Yes    Review of Systems  10 systems reviewed and found to be negative, except as noted in the HPI.   Allergies  Amoxicillin and Penicillins  Home Medications   Prior to Admission medications   Medication Sig Start Date End Date Taking? Authorizing Provider  acetaminophen (TYLENOL) 500 MG tablet Take 500 mg by mouth every 6 (six) hours as needed for headache.    Historical Provider, MD  albuterol (PROVENTIL HFA;VENTOLIN HFA) 108 (90 BASE) MCG/ACT inhaler Inhale 2 puffs into the lungs every 2 (two) hours as needed for wheezing or shortness of breath (cough). 07/03/12   Aubreanna Percle, PA-C  albuterol (PROVENTIL HFA;VENTOLIN HFA) 108 (90 BASE) MCG/ACT inhaler Inhale 2 puffs into the lungs every 6 (six) hours as needed for wheezing or shortness of breath. 11/16/13   Derwood Kaplan, MD  cyclobenzaprine (FLEXERIL) 5 MG tablet Take 5 mg by mouth 3 (three) times daily as needed for muscle spasms.    Historical Provider, MD  ibuprofen (ADVIL,MOTRIN) 200 MG tablet Take 200 mg by  mouth every 6 (six) hours as needed for headache.    Historical Provider, MD  ibuprofen (ADVIL,MOTRIN) 800 MG tablet Take 1 tablet (800 mg total) by mouth 3 (three) times daily. 09/12/13   Monte Fantasia, PA-C  predniSONE (DELTASONE) 50 MG tablet Take 1 tablet (50 mg total) by mouth daily. 11/16/13   Ankit Nanavati, MD   BP 109/80 mmHg  Pulse 119  Temp(Src) 97.9 F (36.6 C) (Oral)  Resp 18  SpO2 95% Physical Exam  Constitutional: He is oriented to person, place, and time. He appears well-developed and well-nourished. No distress.  HENT:  Head: Normocephalic.  Eyes: Conjunctivae and EOM are normal.  Cardiovascular: Regular rhythm and intact distal pulses.   Mild tachycardia  Pulmonary/Chest: Effort normal. No stridor. No respiratory distress. He has wheezes. He has no rales. He exhibits no tenderness.  No tachypnea, patient is speaking in full sentences, very comfortable. He does have slightly reduced air movement in all fields in addition to trace expiratory wheezing.  Note that this evaluation was after the first nebulizer treatment was given  Abdominal: Soft. There is no tenderness.  Musculoskeletal: Normal range of motion.  Neurological: He is alert and oriented to person, place, and time.  Psychiatric: He has a normal mood and affect.  Nursing note and vitals reviewed.   ED Course  Procedures (including critical care time) Labs Review Labs Reviewed - No data to display  Imaging Review No results found.   EKG Interpretation None  MDM   Final diagnoses:  Asthma exacerbation    Filed Vitals:   02/23/14 1421  BP: 109/80  Pulse: 119  Temp: 97.9 F (36.6 C)  TempSrc: Oral  Resp: 18  SpO2: 95%    Medications  ipratropium-albuterol (DUONEB) 0.5-2.5 (3) MG/3ML nebulizer solution 3 mL (3 mLs Nebulization Given 02/23/14 1425)  predniSONE (DELTASONE) tablet 60 mg (60 mg Oral Given 02/23/14 1505)  albuterol (PROVENTIL HFA;VENTOLIN HFA) 108 (90 BASE) MCG/ACT inhaler 2  puff (2 puffs Inhalation Given 02/23/14 1505)  albuterol (PROVENTIL) (2.5 MG/3ML) 0.083% nebulizer solution 5 mg (5 mg Nebulization Given 02/23/14 1505)    Lawrence Gutierrez is a pleasant 29 y.o. male presenting with asthma exacerbation, is out of his medications. Patient is saturating well on room air, no fever or cough to suggest pneumonia. I've evaluated the patient after his first DuoNeb was given. Pt will be given prednisone and another albuterol nebulizer treatment. After reassessment there is better air movement and improvement in the artery mild wheezing that he had. I will start him on a prednisone burst. Patient will be given a nebulizer to go home with.  Evaluation does not show pathology that would require ongoing emergent intervention or inpatient treatment. Pt is hemodynamically stable and mentating appropriately. Discussed findings and plan with patient/guardian, who agrees with care plan. All questions answered. Return precautions discussed and outpatient follow up given.   New Prescriptions   ALBUTEROL (PROVENTIL HFA;VENTOLIN HFA) 108 (90 BASE) MCG/ACT INHALER    Inhale 2 puffs into the lungs every 4 (four) hours as needed for wheezing or shortness of breath.   PREDNISONE (DELTASONE) 50 MG TABLET    Take 1 tablet daily with breakfast         Wynetta Emeryicole Rayshad Riviello, PA-C 02/23/14 1533  Richardean Canalavid H Yao, MD 02/23/14 [id number]

## 2014-02-23 NOTE — Discharge Instructions (Signed)
Do not hesitate to return to the emergency room for any new, worsening or concerning symptoms. ° °Please obtain primary care using resource guide below. But the minute you were seen in the emergency room and that they will need to obtain records for further outpatient management. ° ° °Asthma °Asthma is a recurring condition in which the airways tighten and narrow. Asthma can make it difficult to breathe. It can cause coughing, wheezing, and shortness of breath. Asthma episodes, also called asthma attacks, range from minor to life-threatening. Asthma cannot be cured, but medicines and lifestyle changes can help control it. °CAUSES °Asthma is believed to be caused by inherited (genetic) and environmental factors, but its exact cause is unknown. Asthma may be triggered by allergens, lung infections, or irritants in the air. Asthma triggers are different for each person. Common triggers include:  °· Animal dander. °· Dust mites. °· Cockroaches. °· Pollen from trees or grass. °· Mold. °· Smoke. °· Air pollutants such as dust, household cleaners, hair sprays, aerosol sprays, paint fumes, strong chemicals, or strong odors. °· Cold air, weather changes, and winds (which increase molds and pollens in the air). °· Strong emotional expressions such as crying or laughing hard. °· Stress. °· Certain medicines (such as aspirin) or types of drugs (such as beta-blockers). °· Sulfites in foods and drinks. Foods and drinks that may contain sulfites include dried fruit, potato chips, and sparkling grape juice. °· Infections or inflammatory conditions such as the flu, a cold, or an inflammation of the nasal membranes (rhinitis). °· Gastroesophageal reflux disease (GERD). °· Exercise or strenuous activity. °SYMPTOMS °Symptoms may occur immediately after asthma is triggered or many hours later. Symptoms include: °· Wheezing. °· Excessive nighttime or early morning coughing. °· Frequent or severe coughing with a common cold. °· Chest  tightness. °· Shortness of breath. °DIAGNOSIS  °The diagnosis of asthma is made by a review of your medical history and a physical exam. Tests may also be performed. These may include: °· Lung function studies. These tests show how much air you breathe in and out. °· Allergy tests. °· Imaging tests such as X-rays. °TREATMENT  °Asthma cannot be cured, but it can usually be controlled. Treatment involves identifying and avoiding your asthma triggers. It also involves medicines. There are 2 classes of medicine used for asthma treatment:  °· Controller medicines. These prevent asthma symptoms from occurring. They are usually taken every day. °· Reliever or rescue medicines. These quickly relieve asthma symptoms. They are used as needed and provide short-term relief. °Your health care provider will help you create an asthma action plan. An asthma action plan is a written plan for managing and treating your asthma attacks. It includes a list of your asthma triggers and how they may be avoided. It also includes information on when medicines should be taken and when their dosage should be changed. An action plan may also involve the use of a device called a peak flow meter. A peak flow meter measures how well the lungs are working. It helps you monitor your condition. °HOME CARE INSTRUCTIONS  °· Take medicines only as directed by your health care provider. Speak with your health care provider if you have questions about how or when to take the medicines. °· Use a peak flow meter as directed by your health care provider. Record and keep track of readings. °· Understand and use the action plan to help minimize or stop an asthma attack without needing to seek medical care. °· Control   your home environment in the following ways to help prevent asthma attacks: °¨ Do not smoke. Avoid being exposed to secondhand smoke. °¨ Change your heating and air conditioning filter regularly. °¨ Limit your use of fireplaces and wood  stoves. °¨ Get rid of pests (such as roaches and mice) and their droppings. °¨ Throw away plants if you see mold on them. °¨ Clean your floors and dust regularly. Use unscented cleaning products. °¨ Try to have someone else vacuum for you regularly. Stay out of rooms while they are being vacuumed and for a short while afterward. If you vacuum, use a dust mask from a hardware store, a double-layered or microfilter vacuum cleaner bag, or a vacuum cleaner with a HEPA filter. °¨ Replace carpet with wood, tile, or vinyl flooring. Carpet can trap dander and dust. °¨ Use allergy-proof pillows, mattress covers, and box spring covers. °¨ Wash bed sheets and blankets every week in hot water and dry them in a dryer. °¨ Use blankets that are made of polyester or cotton. °¨ Clean bathrooms and kitchens with bleach. If possible, have someone repaint the walls in these rooms with mold-resistant paint. Keep out of the rooms that are being cleaned and painted. °¨ Wash hands frequently. °SEEK MEDICAL CARE IF:  °· You have wheezing, shortness of breath, or a cough even if taking medicine to prevent attacks. °· The colored mucus you cough up (sputum) is thicker than usual. °· Your sputum changes from clear or white to yellow, green, gray, or bloody. °· You have any problems that may be related to the medicines you are taking (such as a rash, itching, swelling, or trouble breathing). °· You are using a reliever medicine more than 2-3 times per week. °· Your peak flow is still at 50-79% of your personal best after following your action plan for 1 hour. °· You have a fever. °SEEK IMMEDIATE MEDICAL CARE IF:  °· You seem to be getting worse and are unresponsive to treatment during an asthma attack. °· You are short of breath even at rest. °· You get short of breath when doing very little physical activity. °· You have difficulty eating, drinking, or talking due to asthma symptoms. °· You develop chest pain. °· You develop a fast  heartbeat. °· You have a bluish color to your lips or fingernails. °· You are light-headed, dizzy, or faint. °· Your peak flow is less than 50% of your personal best. °MAKE SURE YOU:  °· Understand these instructions. °· Will watch your condition. °· Will get help right away if you are not doing well or get worse. °Document Released: 01/01/2005 Document Revised: 05/18/2013 Document Reviewed: 07/31/2012 °ExitCare® Patient Information ©2015 [employer]. This information is not intended to replace advice given to you by your health care provider. Make sure you discuss any questions you have with your health care provider. ° ° °Emergency Department Resource Guide °1) Find a Doctor and Pay Out of Pocket °Although you won't have to find out who is covered by your insurance plan, it is a good idea to ask around and get recommendations. You will then need to call the office and see if the doctor you have chosen will accept you as a new patient and what types of options they offer for patients who are self-pay. Some doctors offer discounts or will set up payment plans for their patients who do not have insurance, but you will need to ask so you aren't surprised when you get to your   appointment. ° °2) Contact Your Local Health Department °Not all health departments have doctors that can see patients for sick visits, but many do, so it is worth a call to see if yours does. If you don't know where your local health department is, you can check in your phone book. The CDC also has a tool to help you locate your state's health department, and many state websites also have listings of all of their local health departments. ° °3) Find a Walk-in Clinic °If your illness is not likely to be very severe or complicated, you may want to try a walk in clinic. These are popping up all over the country in pharmacies, drugstores, and shopping centers. They're usually staffed by nurse practitioners or physician assistants that have been  trained to treat common illnesses and complaints. They're usually fairly quick and inexpensive. However, if you have serious medical issues or chronic medical problems, these are probably not your best option. ° °No Primary Care Doctor: °- Call [facility] at  832-8000 - they can help you locate a primary care doctor that  accepts your insurance, provides certain services, etc. °- Physician Referral Service- 1-800-533-3463 ° °Chronic Pain Problems: °Organization         Address  Phone   Notes  °[facility]  (336) 297-2271 Patients need to be referred by their primary care doctor.  ° °Medication Assistance: °Organization         Address  Phone   Notes  °[place] [facility] [address] °[place], [place] [address] (336) 641-8030 --Must be a resident of [place] °-- Must have NO insurance coverage whatsoever (no Medicaid/ Medicare, etc.) °-- The pt. MUST have a primary care doctor that directs their care regularly and follows them in the community °  °[facility]  (866) 331-1348   °[employer]  (888) 892-1162   ° °Agencies that provide inexpensive medical care: °Organization         Address  Phone   Notes  °[facility]  (336) 832-8035   °[facility]    (336) 832-7272   °[facility] [address] °[place], [place] [address] (336) 832-4777   °[facility] [address], °[place] (336) 271-4999   °[facility]    (336) 373-0678   °[facility]    (336) 272-1050   °[facility] ° [address], [place] Phone:  (336) 832-4444, Fax:  (336) 832-4440 Hours of Operation:  9 am - 6 pm, M-F.  Also accepts Medicaid/Medicare and self-pay.  °[facility] ° [address], [place] Phone: (336) [phone], Fax: (336) [phone]. Hours of Operation:  8:30 am - 5:30 pm, M-F.  Also accepts Medicaid and self-pay.   °[facility] [place] [address], [place] Phone: (336) 878-6027   °[facility] [address], [place], [place] (336)723-1848, Ext. 123 Mondays & Thursdays: 7-9 AM.  First 15 patients are seen on a first come, first serve basis. °  ° °Medicaid-accepting [place] Providers: ° °Organization         Address  Phone   Notes  °[facility] [address], [place] (336) 641-2100 Also accepts self-pay patients.  °[facility] [address], [place] ° (336) 856-9996   °[facility] [address], [place] (336) 288-8857   °[facility] [address] [place] [address], [place] (336) 299-7000   °Veita Bland [address], [place]  ° (336) 373-1557 Only accepts [place] Medicaid patients after they have their name applied to their card.  ° °Self-Pay (no insurance) in [place]: ° °Organization         Address  Phone   Notes  °Sickle Cell Patients, [facility] [address], [place] (336) 832-1970   °[facility] [address], [place] (336) 832-4400   °[facility] ° [address],  (336) 992-4800   °[facility]/Dr. Osei-Bonsu ° [address] [place] [address], [place] or [address], [place] (336) [phone] Phone number for both [place] and [place] locations is the same.  °[facility] [address], [place] (336) 299-0000   °[facility] [address] [place] [address], [place] or [address] (336) 852-7530 °(336) 878-2260   °[facility] [address], [place] (336) 350-1642, phone; (336) 294-5005, fax Sees patients 1st and 3rd Saturday of every month.  Must not qualify for public or private insurance (i.e. Medicaid, Medicare, [place] Health Choice, Veterans' Benefits) • Household income should be no  more than 200% of the poverty level •The clinic cannot treat you if you are pregnant or think you are pregnant • Sexually transmitted diseases are not treated at the clinic.  ° ° °Dental Care: °Organization         Address  Phone  Notes  °[facility] [address], [place] (336) 641-6152 Accepts children up to age 21 who are enrolled in Medicaid or [place] Health Choice; pregnant women with a Medicaid card; and children who have applied for Medicaid or [place] Health Choice, but were declined, whose parents can pay a reduced fee at time of service.  °[facility] [place]  [address], [place] (336) 641-7733 Accepts children up to age 21 who are enrolled in Medicaid or [place] Health Choice; pregnant women with a Medicaid card; and children who have applied for Medicaid or [place] Health Choice, but were declined, whose parents can pay a reduced fee at time of service.  °[facility] ° [address],  (336) 641-4533 Patients are seen by appointment only. Walk-ins are not accepted. [facility] will see patients 18 years of age and older. °Monday - Tuesday (8am-5pm) °Most Wednesdays (8:30-5pm) °$30 per visit, cash only  °[facility] ° [address], [place] (336) 641-4533 Patients are seen by appointment only. Walk-ins are not accepted. [facility] will see patients 18 years of age and older. °One Wednesday Evening (Monthly: Volunteer Based).  $30 per visit, cash only  °[facility]  (919) 537-3737 for adults; Children under age 4, call Graduate Pediatric Dentistry at ([phone]) [phone]. Children aged 4-14, please call (919) [phone] to request a pediatric application. ° Dental services are provided in all areas of dental care including fillings, crowns and bridges, complete and partial dentures, implants, gum treatment, root canals,  and extractions. Preventive care is also provided. Treatment is provided to both adults and children. °Patients are selected via a lottery and there is often a waiting list. °  °[facility] [address], °[place] ° (336) 763-8833 [email or web address] °  °[facility] [address], [place], [place] (336)723-1848, Ext. 123 Second and Fourth Thursday of each month, opens at 6:30 AM; Clinic ends at 9 AM.  Patients are seen on a first-come first-served basis, and a limited number are seen during each clinic.  ° °[facility] ° [address], [place], [place] (336) 723-7904   Eligibility Requirements °You must have lived in [place], [place], or [place] counties for at least the last three months. °  You cannot be eligible for state or federal sponsored healthcare insurance, including Veterans Administration, Medicaid, or Medicare. °  You generally cannot be eligible for healthcare insurance through your employer.  °  How to apply: °Eligibility screenings are held every Tuesday and Wednesday afternoon from 1:00 pm until 4:00 pm. You do not need an appointment for the interview!  °[facility] [address], [place], [place] 336-631-2330   °[facility]  336-342-8273   °[facility]  336-703-3100   °[facility]  336-570-6415   ° °Behavioral Health Resources in the Community: °Intensive Outpatient Programs °Organization         Address  Phone  Notes  °[facility] [address], [place], [place] 336-878-6098   °[facility] [address], [place], [place] 336-832-9800   °[facility] [address], [place], [place] ° 336-882-2125   °[facility] [address],  °[place], [place] 1-800-853-5163 or 336-641-4981   °Substance Abuse Resources °Organization         Address  Phone  Notes  °[facility]  336-882-2125    °[facility]  336-784-9470   °[facility]  336-285-9073   °[facility]  336-845-3988   °Residential & Outpatient Substance Abuse Program  1-800-659-3381   °Psychological Services °Organization         Address  Phone  Notes  °[facility]  336- 832-9600   °[facility]  336- 378-7881   °[facility] [address], [place] 1-800-853-5163 or 336-641-4981   ° °Mobile Crisis Teams °Organization         Address  Phone  Notes  °[facility], Mobile Crisis Care Unit  1-877-626-1772   °Assertive °[facility] ° [address] [place], [place] 336-834-9664   °Sharon DeEsch [address] °[place] 336-554-5454   ° °Self-Help/Support Groups °Organization         Address  Phone             Notes  °[facility] - variety of support groups  336- 373-1402 Call for more information  °Narcotics Anonymous (NA), [facility] [address], °[place]  2 meetings at this location  ° °Residential Treatment Programs °Organization         Address  Phone  Notes  °[facility] [address],    °[place]  1-866-801-8205   °[facility] ° [address], [place], [place] 704-293-8524   °[facility] [address], [place] 336-845-3988 Admissions: 8am-3pm M-F  °[facility] [address]    °[place], [place] 336-841-1104   °[facility] [address], [place], [place] 336-379-7146   °[facility] [address]  °[place],  336-285-9073   °  [facility] Intensive Outpatient [address], [place], [place] 336-852-3033   °[facility] [address]  °[place], [place] 1-877-615-2722 or 336-784-9470   °[facility] [address] [place], [place] 336-227-7417 Accepts Medicaid  °[facility] [address]   °[place] 1-800-659-3381 Substance Abuse/Addiction Treatment  ° °[place] Behavioral Health Resources °Organization         Address  Phone  Notes  °[facility]  (888) 581-9988   °Julie Brannon, PhD [address] [place], [place]   (336) 349-5553 or (336) 951-0000   °[facility]   [address] °[place], [place] (336) 349-4454   °[facility] [address], [place], [place] (336) 342-8316 Insurance/Medicaid/sponsorship through [facility]  °[facility] [address]                                    [place], [place] (336) 342-8316 Therapy/tele-psych/case  °[facility] [address]  ° [place], [place] (336) 349-2233    °Dr. Arfeen  (336) 349-4544   °[facility] [place]  [employer] [place] [facility] 1) [address], [place] °2) [address], [place] °3)  [address], [place] (336) 349-3220 °(336) 342-7768 ° °(336) 342-8140   °[place] Child Abuse [facility] (336) 342-1394 or (336) 342-3537 (After Hours)    ° ° ° ° °

## 2014-02-23 NOTE — ED Notes (Signed)
Pt here with asthma sx x 2 days; wheezing noted; pt sts out of meds

## 2014-07-29 ENCOUNTER — Emergency Department (HOSPITAL_COMMUNITY): Payer: Self-pay

## 2014-07-29 ENCOUNTER — Emergency Department (HOSPITAL_COMMUNITY)
Admission: EM | Admit: 2014-07-29 | Discharge: 2014-07-29 | Disposition: A | Discharge status: Home / Self Care | Payer: Self-pay | Attending: Emergency Medicine | Admitting: Emergency Medicine

## 2014-07-29 ENCOUNTER — Encounter (HOSPITAL_COMMUNITY): Payer: Self-pay | Admitting: *Deleted

## 2014-07-29 DIAGNOSIS — Z791 Long term (current) use of non-steroidal anti-inflammatories (NSAID): Secondary | ICD-10-CM | POA: Insufficient documentation

## 2014-07-29 DIAGNOSIS — J45901 Unspecified asthma with (acute) exacerbation: Secondary | ICD-10-CM | POA: Insufficient documentation

## 2014-07-29 DIAGNOSIS — Z79899 Other long term (current) drug therapy: Secondary | ICD-10-CM | POA: Insufficient documentation

## 2014-07-29 DIAGNOSIS — Z880 Allergy status to penicillin: Secondary | ICD-10-CM | POA: Insufficient documentation

## 2014-07-29 MED ORDER — DEXAMETHASONE SODIUM PHOSPHATE 10 MG/ML IJ SOLN
10.0000 mg | Freq: Once | INTRAMUSCULAR | Status: AC
  Administered 2014-07-29: 10 mg via INTRAMUSCULAR
  Filled 2014-07-29: qty 1

## 2014-07-29 MED ORDER — ALBUTEROL SULFATE HFA 108 (90 BASE) MCG/ACT IN AERS: 1.0000 | INHALATION_SPRAY | Freq: Four times a day (QID) | RESPIRATORY_TRACT | Status: DC | PRN

## 2014-07-29 MED ORDER — IPRATROPIUM-ALBUTEROL 0.5-2.5 (3) MG/3ML IN SOLN
3.0000 mL | Freq: Once | RESPIRATORY_TRACT | Status: AC
  Administered 2014-07-29: 3 mL via RESPIRATORY_TRACT
  Filled 2014-07-29: qty 3

## 2014-07-29 MED ORDER — PREDNISONE 10 MG (21) PO TBPK: 10.0000 mg | ORAL_TABLET | Freq: Every day | ORAL | Status: DC

## 2014-07-29 MED ORDER — ALBUTEROL SULFATE HFA 108 (90 BASE) MCG/ACT IN AERS
2.0000 | INHALATION_SPRAY | Freq: Once | RESPIRATORY_TRACT | Status: AC
  Administered 2014-07-29: 2 via RESPIRATORY_TRACT
  Filled 2014-07-29: qty 6.7

## 2014-07-29 NOTE — ED Notes (Signed)
Pt states asthma flare-up since yesterday since he cleaned his house yesterday.  States increased wheezing.  Albuterol inhaler ran out 1 month ago.

## 2014-07-29 NOTE — ED Provider Notes (Signed)
CSN: [id number]     Arrival date & time 07/29/14  0710 History   First MD Initiated Contact with Patient 07/29/14 [phone]     Chief Complaint  Patient presents with  . Asthma     (Consider location/radiation/quality/duration/timing/severity/associated sxs/prior Treatment) HPI   Pt with hx asthma p/w exacerbation of his asthma that began two days ago while cleaning his house.  States he always gets an asthma exacerbation when he is around mold.  Has had chest tightness, SOB, wheezing.  Denies fevers, productive cough, hemoptysis, leg swelling.  States this feels like his typical asthma.  Has been out of his albuterol inhaler x 1 month.  Has had neb treatment in ED and is feeling much better now, no further symptoms.   Past Medical History  Diagnosis Date  . Asthma    History reviewed. No pertinent past surgical history. No family history on file. History  Substance Use Topics  . Smoking status: Never Smoker   . Smokeless tobacco: Not on file  . Alcohol Use: Yes    Review of Systems  All other systems reviewed and are negative.     Allergies  Amoxicillin and Penicillins  Home Medications   Prior to Admission medications   Medication Sig Start Date End Date Taking? Authorizing Provider  acetaminophen (TYLENOL) 500 MG tablet Take 500 mg by mouth every 6 (six) hours as needed for headache.    Historical Provider, MD  albuterol (PROVENTIL HFA;VENTOLIN HFA) 108 (90 BASE) MCG/ACT inhaler Inhale 1-2 puffs into the lungs every 6 (six) hours as needed for wheezing or shortness of breath. 07/29/14   Trixie DredgeEmily Vester Balthazor, PA-C  cyclobenzaprine (FLEXERIL) 5 MG tablet Take 5 mg by mouth 3 (three) times daily as needed for muscle spasms.    Historical Provider, MD  ibuprofen (ADVIL,MOTRIN) 200 MG tablet Take 200 mg by mouth every 6 (six) hours as needed for headache.    Historical Provider, MD  ibuprofen (ADVIL,MOTRIN) 800 MG tablet Take 1 tablet (800 mg total) by mouth 3 (three) times daily. 09/12/13    Ladona MowJoe Mintz, PA-C  predniSONE (STERAPRED UNI-PAK 21 TAB) 10 MG (21) TBPK tablet Take 1 tablet (10 mg total) by mouth daily. Day 1: take 6 tabs.  Day 2: 5 tabs  Day 3: 4 tabs  Day 4: 3 tabs  Day 5: 2 tabs  Day 6: 1 tab 07/29/14   Trixie DredgeEmily Renaye Janicki, PA-C   BP 135/98 mmHg  Pulse 70  Temp(Src) 97.7 F (36.5 C) (Oral)  Resp 18  Ht 5\' 6"  (1.676 m)  Wt 160 lb (72.576 kg)  BMI 25.84 kg/m2  SpO2 97% Physical Exam  Constitutional: He appears well-developed and well-nourished. No distress.  HENT:  Head: Normocephalic and atraumatic.  Neck: Neck supple.  Cardiovascular: Normal rate and regular rhythm.   Pulmonary/Chest: Effort normal and breath sounds normal. No respiratory distress. He has no wheezes. He has no rales.  Musculoskeletal:  LEs without edema or tenderness  Neurological: He is alert. He exhibits normal muscle tone.  Skin: He is not diaphoretic.  Psychiatric: He has a normal mood and affect. His behavior is normal.  Nursing note and vitals reviewed.   ED Course  Procedures (including critical care time) Labs Review Labs Reviewed - No data to display  Imaging Review Dg Chest 2 View  07/29/2014   CLINICAL DATA:  Shortness of breath.  Asthma.  EXAM: CHEST  2 VIEW  COMPARISON:  None.  FINDINGS: Heart size and pulmonary vascularity are normal. Slight  accentuation of the interstitial markings. Increased AP dimension of the chest. No consolidative infiltrates or effusions. Heart size and vascularity are normal. No osseous abnormality.  IMPRESSION: No acute infiltrates. Hyperinflation and accentuated interstitial markings consistent with asthma.   Electronically Signed   By: Francene Boyers M.D.   On: 07/29/2014 08:04     EKG Interpretation None      MDM   Final diagnoses:  Asthma exacerbation    Afebrile, nontoxic patient with asthma p/w his typical asthma symptoms x 3 days.  Better with neb treatment.   D/C home with albuterol, prednisone.   Discussed result, findings, treatment, and  follow up  with patient.  Pt given return precautions.  Pt verbalizes understanding and agrees with plan.         [place], PA-C 07/29/14 [id number]  Purvis Sheffield, MD 07/30/14 [phone]

## 2014-07-29 NOTE — Discharge Instructions (Signed)
Read the information below.  Use the prescribed medication as directed.  Please discuss all new medications with your pharmacist.  You may return to the Emergency Department at any time for worsening condition or any new symptoms that concern you.    If you develop worsening shortness of breath, uncontrolled wheezing, severe chest pain, or fevers despite using tylenol and/or ibuprofen, return for a recheck.     ° ° °Asthma °Asthma is a recurring condition in which the airways tighten and narrow. Asthma can make it difficult to breathe. It can cause coughing, wheezing, and shortness of breath. Asthma episodes, also called asthma attacks, range from minor to life-threatening. Asthma cannot be cured, but medicines and lifestyle changes can help control it. °CAUSES °Asthma is believed to be caused by inherited (genetic) and environmental factors, but its exact cause is unknown. Asthma may be triggered by allergens, lung infections, or irritants in the air. Asthma triggers are different for each person. Common triggers include:  °· Animal dander. °· Dust mites. °· Cockroaches. °· Pollen from trees or grass. °· Mold. °· Smoke. °· Air pollutants such as dust, household cleaners, hair sprays, aerosol sprays, paint fumes, strong chemicals, or strong odors. °· Cold air, weather changes, and winds (which increase molds and pollens in the air). °· Strong emotional expressions such as crying or laughing hard. °· Stress. °· Certain medicines (such as aspirin) or types of drugs (such as beta-blockers). °· Sulfites in foods and drinks. Foods and drinks that may contain sulfites include dried fruit, potato chips, and sparkling grape juice. °· Infections or inflammatory conditions such as the flu, a cold, or an inflammation of the nasal membranes (rhinitis). °· Gastroesophageal reflux disease (GERD). °· Exercise or strenuous activity. °SYMPTOMS °Symptoms may occur immediately after asthma is triggered or many hours later. Symptoms  include: °· Wheezing. °· Excessive nighttime or early morning coughing. °· Frequent or severe coughing with a common cold. °· Chest tightness. °· Shortness of breath. °DIAGNOSIS  °The diagnosis of asthma is made by a review of your medical history and a physical exam. Tests may also be performed. These may include: °· Lung function studies. These tests show how much air you breathe in and out. °· Allergy tests. °· Imaging tests such as X-rays. °TREATMENT  °Asthma cannot be cured, but it can usually be controlled. Treatment involves identifying and avoiding your asthma triggers. It also involves medicines. There are 2 classes of medicine used for asthma treatment:  °· Controller medicines. These prevent asthma symptoms from occurring. They are usually taken every day. °· Reliever or rescue medicines. These quickly relieve asthma symptoms. They are used as needed and provide short-term relief. °Your health care provider will help you create an asthma action plan. An asthma action plan is a written plan for managing and treating your asthma attacks. It includes a list of your asthma triggers and how they may be avoided. It also includes information on when medicines should be taken and when their dosage should be changed. An action plan may also involve the use of a device called a peak flow meter. A peak flow meter measures how well the lungs are working. It helps you monitor your condition. °HOME CARE INSTRUCTIONS  °· Take medicines only as directed by your health care provider. Speak with your health care provider if you have questions about how or when to take the medicines. °· Use a peak flow meter as directed by your health care provider. Record and keep track of   readings. °· Understand and use the action plan to help minimize or stop an asthma attack without needing to seek medical care. °· Control your home environment in the following ways to help prevent asthma attacks: °¨ Do not smoke. Avoid being exposed to  secondhand smoke. °¨ Change your heating and air conditioning filter regularly. °¨ Limit your use of fireplaces and wood stoves. °¨ Get rid of pests (such as roaches and mice) and their droppings. °¨ Throw away plants if you see mold on them. °¨ Clean your floors and dust regularly. Use unscented cleaning products. °¨ Try to have someone else vacuum for you regularly. Stay out of rooms while they are being vacuumed and for a short while afterward. If you vacuum, use a dust mask from a hardware store, a double-layered or microfilter vacuum cleaner bag, or a vacuum cleaner with a HEPA filter. °¨ Replace carpet with wood, tile, or vinyl flooring. Carpet can trap dander and dust. °¨ Use allergy-proof pillows, mattress covers, and box spring covers. °¨ Wash bed sheets and blankets every week in hot water and dry them in a dryer. °¨ Use blankets that are made of polyester or cotton. °¨ Clean bathrooms and kitchens with bleach. If possible, have someone repaint the walls in these rooms with mold-resistant paint. Keep out of the rooms that are being cleaned and painted. °¨ Wash hands frequently. °SEEK MEDICAL CARE IF:  °· You have wheezing, shortness of breath, or a cough even if taking medicine to prevent attacks. °· The colored mucus you cough up (sputum) is thicker than usual. °· Your sputum changes from clear or white to yellow, green, gray, or bloody. °· You have any problems that may be related to the medicines you are taking (such as a rash, itching, swelling, or trouble breathing). °· You are using a reliever medicine more than 2-3 times per week. °· Your peak flow is still at 50-79% of your personal best after following your action plan for 1 hour. °· You have a fever. °SEEK IMMEDIATE MEDICAL CARE IF:  °· You seem to be getting worse and are unresponsive to treatment during an asthma attack. °· You are short of breath even at rest. °· You get short of breath when doing very little physical activity. °· You have  difficulty eating, drinking, or talking due to asthma symptoms. °· You develop chest pain. °· You develop a fast heartbeat. °· You have a bluish color to your lips or fingernails. °· You are light-headed, dizzy, or faint. °· Your peak flow is less than 50% of your personal best. °MAKE SURE YOU:  °· Understand these instructions. °· Will watch your condition. °· Will get help right away if you are not doing well or get worse. °Document Released: 01/01/2005 Document Revised: 05/18/2013 Document Reviewed: 07/31/2012 °[employer]® Patient Information ©2015 [employer]. This information is not intended to replace advice given to you by your health care provider. Make sure you discuss any questions you have with your health care provider. ° °

## 2014-07-29 NOTE — ED Notes (Signed)
Monitoring patient for 15 minutes after IM injection.  Pending no vital sign or changes with the patient, will discharge at 0835.

## 2014-12-04 ENCOUNTER — Emergency Department (HOSPITAL_COMMUNITY)
Admission: EM | Admit: 2014-12-04 | Discharge: 2014-12-04 | Disposition: A | Discharge status: Home / Self Care | Payer: Self-pay | Attending: Emergency Medicine | Admitting: Emergency Medicine

## 2014-12-04 ENCOUNTER — Encounter (HOSPITAL_COMMUNITY): Payer: Self-pay | Admitting: *Deleted

## 2014-12-04 DIAGNOSIS — J45901 Unspecified asthma with (acute) exacerbation: Secondary | ICD-10-CM | POA: Insufficient documentation

## 2014-12-04 DIAGNOSIS — Z880 Allergy status to penicillin: Secondary | ICD-10-CM | POA: Insufficient documentation

## 2014-12-04 DIAGNOSIS — Z7952 Long term (current) use of systemic steroids: Secondary | ICD-10-CM | POA: Insufficient documentation

## 2014-12-04 DIAGNOSIS — Z791 Long term (current) use of non-steroidal anti-inflammatories (NSAID): Secondary | ICD-10-CM | POA: Insufficient documentation

## 2014-12-04 MED ORDER — PREDNISONE 20 MG PO TABS
60.0000 mg | ORAL_TABLET | Freq: Once | ORAL | Status: AC
  Administered 2014-12-04: 60 mg via ORAL
  Filled 2014-12-04: qty 3

## 2014-12-04 MED ORDER — ALBUTEROL SULFATE HFA 108 (90 BASE) MCG/ACT IN AERS
2.0000 | INHALATION_SPRAY | Freq: Once | RESPIRATORY_TRACT | Status: AC
  Administered 2014-12-04: 2 via RESPIRATORY_TRACT
  Filled 2014-12-04: qty 6.7

## 2014-12-04 MED ORDER — ALBUTEROL SULFATE HFA 108 (90 BASE) MCG/ACT IN AERS: 2.0000 | INHALATION_SPRAY | RESPIRATORY_TRACT | Status: DC | PRN

## 2014-12-04 MED ORDER — PREDNISONE 20 MG PO TABS: 40.0000 mg | ORAL_TABLET | Freq: Every day | ORAL | Status: DC

## 2014-12-04 MED ORDER — ALBUTEROL SULFATE (2.5 MG/3ML) 0.083% IN NEBU: 2.5000 mg | INHALATION_SOLUTION | Freq: Four times a day (QID) | RESPIRATORY_TRACT | Status: DC | PRN

## 2014-12-04 MED ORDER — IPRATROPIUM-ALBUTEROL 0.5-2.5 (3) MG/3ML IN SOLN
3.0000 mL | Freq: Once | RESPIRATORY_TRACT | Status: AC
  Administered 2014-12-04: 3 mL via RESPIRATORY_TRACT
  Filled 2014-12-04: qty 3

## 2014-12-04 NOTE — Discharge Instructions (Signed)
Do not hesitate to return to the emergency room for any new, worsening or concerning symptoms. ° °Please obtain primary care using resource guide below. Let them know that you were seen in the emergency room and that they will need to obtain records for further outpatient management. ° ° °Asthma, Adult °Asthma is a recurring condition in which the airways tighten and narrow. Asthma can make it difficult to breathe. It can cause coughing, wheezing, and shortness of breath. Asthma episodes, also called asthma attacks, range from minor to life-threatening. Asthma cannot be cured, but medicines and lifestyle changes can help control it. °CAUSES °Asthma is believed to be caused by inherited (genetic) and environmental factors, but its exact cause is unknown. Asthma may be triggered by allergens, lung infections, or irritants in the air. Asthma triggers are different for each person. Common triggers include:  °· Animal dander. °· Dust mites. °· Cockroaches. °· Pollen from trees or grass. °· Mold. °· Smoke. °· Air pollutants such as dust, household cleaners, hair sprays, aerosol sprays, paint fumes, strong chemicals, or strong odors. °· Cold air, weather changes, and winds (which increase molds and pollens in the air). °· Strong emotional expressions such as crying or laughing hard. °· Stress. °· Certain medicines (such as aspirin) or types of drugs (such as beta-blockers). °· Sulfites in foods and drinks. Foods and drinks that may contain sulfites include dried fruit, potato chips, and sparkling grape juice. °· Infections or inflammatory conditions such as the flu, a cold, or an inflammation of the nasal membranes (rhinitis). °· Gastroesophageal reflux disease (GERD). °· Exercise or strenuous activity. °SYMPTOMS °Symptoms may occur immediately after asthma is triggered or many hours later. Symptoms include: °· Wheezing. °· Excessive nighttime or early morning coughing. °· Frequent or severe coughing with a common  cold. °· Chest tightness. °· Shortness of breath. °DIAGNOSIS  °The diagnosis of asthma is made by a review of your medical history and a physical exam. Tests may also be performed. These may include: °· Lung function studies. These tests show how much air you breathe in and out. °· Allergy tests. °· Imaging tests such as X-rays. °TREATMENT  °Asthma cannot be cured, but it can usually be controlled. Treatment involves identifying and avoiding your asthma triggers. It also involves medicines. There are 2 classes of medicine used for asthma treatment:  °· Controller medicines. These prevent asthma symptoms from occurring. They are usually taken every day. °· Reliever or rescue medicines. These quickly relieve asthma symptoms. They are used as needed and provide short-term relief. °Your health care provider will help you create an asthma action plan. An asthma action plan is a written plan for managing and treating your asthma attacks. It includes a list of your asthma triggers and how they may be avoided. It also includes information on when medicines should be taken and when their dosage should be changed. An action plan may also involve the use of a device called a peak flow meter. A peak flow meter measures how well the lungs are working. It helps you monitor your condition. °HOME CARE INSTRUCTIONS  °· Take medicines only as directed by your health care provider. Speak with your health care provider if you have questions about how or when to take the medicines. °· Use a peak flow meter as directed by your health care provider. Record and keep track of readings. °· Understand and use the action plan to help minimize or stop an asthma attack without needing to seek medical care. °·   Control your home environment in the following ways to help prevent asthma attacks: °¨ Do not smoke. Avoid being exposed to secondhand smoke. °¨ Change your heating and air conditioning filter regularly. °¨ Limit your use of fireplaces and  wood stoves. °¨ Get rid of pests (such as roaches and mice) and their droppings. °¨ Throw away plants if you see mold on them. °¨ Clean your floors and dust regularly. Use unscented cleaning products. °¨ Try to have someone else vacuum for you regularly. Stay out of rooms while they are being vacuumed and for a short while afterward. If you vacuum, use a dust mask from a hardware store, a double-layered or microfilter vacuum cleaner bag, or a vacuum cleaner with a HEPA filter. °¨ Replace carpet with wood, tile, or vinyl flooring. Carpet can trap dander and dust. °¨ Use allergy-proof pillows, mattress covers, and box spring covers. °¨ Wash bed sheets and blankets every week in hot water and dry them in a dryer. °¨ Use blankets that are made of polyester or cotton. °¨ Clean bathrooms and kitchens with bleach. If possible, have someone repaint the walls in these rooms with mold-resistant paint. Keep out of the rooms that are being cleaned and painted. °¨ Wash hands frequently. °SEEK MEDICAL CARE IF:  °· You have wheezing, shortness of breath, or a cough even if taking medicine to prevent attacks. °· The colored mucus you cough up (sputum) is thicker than usual. °· Your sputum changes from clear or white to yellow, green, gray, or bloody. °· You have any problems that may be related to the medicines you are taking (such as a rash, itching, swelling, or trouble breathing). °· You are using a reliever medicine more than 2-3 times per week. °· Your peak flow is still at 50-79% of your personal best after following your action plan for 1 hour. °· You have a fever. °SEEK IMMEDIATE MEDICAL CARE IF:  °· You seem to be getting worse and are unresponsive to treatment during an asthma attack. °· You are short of breath even at rest. °· You get short of breath when doing very little physical activity. °· You have difficulty eating, drinking, or talking due to asthma symptoms. °· You develop chest pain. °· You develop a fast  heartbeat. °· You have a bluish color to your lips or fingernails. °· You are light-headed, dizzy, or faint. °· Your peak flow is less than 50% of your personal best. °  °This information is not intended to replace advice given to you by your health care provider. Make sure you discuss any questions you have with your health care provider. °  °Document Released: 01/01/2005 Document Revised: 09/22/2014 Document Reviewed: 07/31/2012 °Elsevier Interactive Patient Education ©2016 Elsevier Inc[employer] ° ° °Emergency Department Resource Guide °1) Find a Doctor and Pay Out of Pocket °Although you won't have to find out who is covered by your insurance plan, it is a good idea to ask around and get recommendations. You will then need to call the office and see if the doctor you have chosen will accept you as a new patient and what types of options they offer for patients who are self-pay. Some doctors offer discounts or will set up payment plans for their patients who do not have insurance, but you will need to ask so you aren't surprised when you get to your appointment. ° °2) Contact Your Local Health Department °Not all health departments have doctors that can see patients for sick visits, but   many do, so it is worth a call to see if yours does. If you don't know where your local health department is, you can check in your phone book. The CDC also has a tool to help you locate your state's health department, and many state websites also have listings of all of their local health departments. ° °3) Find a Walk-in Clinic °If your illness is not likely to be very severe or complicated, you may want to try a walk in clinic. These are popping up all over the country in pharmacies, drugstores, and shopping centers. They're usually staffed by nurse practitioners or physician assistants that have been trained to treat common illnesses and complaints. They're usually fairly quick and inexpensive. However, if you have serious medical  issues or chronic medical problems, these are probably not your best option. ° °No Primary Care Doctor: °- Call [facility] at  832-8000 - they can help you locate a primary care doctor that  accepts your insurance, provides certain services, etc. °- Physician Referral Service- 1-800-533-3463 ° °Chronic Pain Problems: °Organization         Address  Phone   Notes  °[facility]  (336) 297-2271 Patients need to be referred by their primary care doctor.  ° °Medication Assistance: °Organization         Address  Phone   Notes  °[place] [facility] [address] °[place], [place] [address] (336) 641-8030 --Must be a resident of [place] °-- Must have NO insurance coverage whatsoever (no Medicaid/ Medicare, etc.) °-- The pt. MUST have a primary care doctor that directs their care regularly and follows them in the community °  °[facility]  (866) 331-1348   °[employer]  (888) 892-1162   ° °Agencies that provide inexpensive medical care: °Organization         Address  Phone   Notes  °[facility]  (336) 832-8035   °[facility]    (336) 832-7272   °[facility] [address] °[place], [place] [address] (336) 832-4777   °[facility] [address], °[place] (336) 271-4999   °[facility]    (336) 373-0678   °[facility]    (336) 272-1050   °[facility] ° [address], [place] Phone:  (336) 832-4444, Fax:  (336) 832-4440 Hours of Operation:  9 am - 6 pm, M-F.  Also accepts Medicaid/Medicare and self-pay.  °[facility] ° [address], [place] Phone: (336) [phone], Fax: (336) [phone]. Hours of Operation:  8:30 am - 5:30 pm, M-F.  Also accepts Medicaid and self-pay.  °[facility] [place] [address], [place] Phone: (336) 878-6027   °[facility] [address], [place], [place]  (336)723-1848, Ext. 123 Mondays & Thursdays: 7-9 AM.  First 15 patients are seen on a first come, first serve basis. °  ° °Medicaid-accepting [place] Providers: ° °Organization         Address  Phone   Notes  °[facility] [address], [place] (336) 641-2100 Also accepts self-pay patients.  °[facility] [address], [place] ° (336) 856-9996   °[facility] [address], [place] (336) 288-8857   °[facility] [address] [place] [address], [place] (336) 299-7000   °Veita   Bland [address], [place]  ° (336) 373-1557 Only accepts [place] Access Medicaid patients after they have their name applied to their card.  ° °Self-Pay (no insurance) in [place]: ° °Organization         Address  Phone   Notes  °Sickle Cell Patients, [facility] [address], [place] (336) 832-1970   °[facility] [address], [place] (336) 832-4400   °[facility] ° [address], [place] (336) 992-4800   °[facility]/Dr. Osei-Bonsu ° [address] [place] [address], [place] or [address], [place] (336) [phone] Phone number for both [place] and [place] locations is the same.  °[facility] [address], [place] (336) 299-0000   °[facility] [address] [place] [address], [place] or [address] (336) 852-7530 °(336) 878-2260   °[facility] [address], [place] (336) 350-1642, phone; (336) 294-5005, fax Sees patients 1st and 3rd Saturday of every month.  Must not qualify for public or private insurance (i.e. Medicaid, Medicare, [place] Health Choice, Veterans' Benefits) • Household income should be no more than 200% of the poverty level •The clinic cannot treat you if you are pregnant or think you are pregnant • Sexually transmitted  diseases are not treated at the clinic.  ° ° °Dental Care: °Organization         Address  Phone  Notes  °[facility] [address], [place] (336) 641-6152 Accepts children up to age 21 who are enrolled in Medicaid or [place] Health Choice; pregnant women with a Medicaid card; and children who have applied for Medicaid or [place] Health Choice, but were declined, whose parents can pay a reduced fee at time of service.  °[facility] [place]  [address], [place] (336) 641-7733 Accepts children up to age 21 who are enrolled in Medicaid or [place] Health Choice; pregnant women with a Medicaid card; and children who have applied for Medicaid or [place] Health Choice, but were declined, whose parents can pay a reduced fee at time of service.  °[facility] ° [address], [place] (336) 641-4533 Patients are seen by appointment only. Walk-ins are not accepted. [facility] will see patients 18 years of age and older. °Monday - Tuesday (8am-5pm) °Most Wednesdays (8:30-5pm) °$30 per visit, cash only  °[facility] ° [address], [place] (336) [phone] Patients are seen by appointment only. Walk-ins are not accepted. [facility] will see patients 18 years of age and older. °One Wednesday Evening (Monthly: Volunteer Based).  $30 per visit, cash only  °[facility]  (919) [phone] for adults; Children under age 4, call Graduate Pediatric Dentistry at ([phone]) [phone]. Children aged 4-14, please call ([phone]) [phone] to request a pediatric application. ° Dental services are provided in all areas of dental care including fillings, crowns and bridges, complete and partial dentures, implants, gum treatment, root canals, and extractions. Preventive care is also provided. Treatment is provided to both adults and children. °Patients are selected via a  lottery and there is often a waiting list. °  °[facility] [address], ° ° (336) 763-8833 [email or web address] °  °[facility] [address], [place], [place] (336)723-1848, Ext. 123   Second and Fourth Thursday of each month, opens at 6:30 AM; Clinic ends at 9 AM.  Patients are seen on a first-come first-served basis, and a limited number are seen during each clinic.  ° °[facility] ° [address], [place], [place] (336) 723-7904   Eligibility Requirements °You must have lived in [place], [place], or [place] counties for at least the last three months. °  You cannot be eligible for state or federal sponsored healthcare insurance, including Veterans Administration, Medicaid, or Medicare. °  You generally cannot be eligible for healthcare insurance through your employer.  °  How to apply: °Eligibility screenings are held every Tuesday and Wednesday afternoon from 1:00 pm until 4:00 pm. You do not need an appointment for the interview!  °[facility] [address], [place], [place] 336-631-2330   °[facility]  336-342-8273   °[facility]  336-703-3100   °[facility]  336-570-6415   ° °Behavioral Health Resources in the Community: °Intensive Outpatient Programs °Organization         Address  Phone  Notes  °[facility] [address], [place], [place] 336-878-6098   °[facility] [address], [place], [place] 336-832-9800   °[facility] [address], [place], [place] ° 336-882-2125   °[facility] [address],  °[place], [place] 1-800-853-5163 or 336-641-4981   °Substance Abuse Resources °Organization         Address  Phone  Notes  °[facility]  336-882-2125   °[facility]  336-784-9470   °[facility]  336-285-9073   °[facility]  336-845-3988   °Residential &  Outpatient Substance Abuse Program  1-800-659-3381   °Psychological Services °Organization         Address  Phone  Notes  °[facility]  336- 832-9600   °[facility]  336- 378-7881   °[facility] [address], [place] 1-800-853-5163 or 336-641-4981   ° °Mobile Crisis Teams °Organization         Address  Phone  Notes  °[facility], Mobile Crisis Care Unit  1-877-626-1772   °Assertive °[facility] ° [address] [place], [place] 336-834-9664   °Sharon DeEsch [address] °[place] 336-554-5454   ° °Self-Help/Support Groups °Organization         Address  Phone             Notes  °[facility] - variety of support groups  336- 373-1402 Call for more information  °Narcotics Anonymous (NA), [facility] [address], °[place]  2 meetings at this location  ° °Residential Treatment Programs °Organization         Address  Phone  Notes  °[facility] [address],    °[place]  1-866-801-8205   °[facility] ° [address], [place], [place] 704-293-8524   °[facility] [address], [place] 336-845-3988 Admissions: 8am-3pm M-F  °[facility] [address]    °[place], [place] 336-841-1104   °[facility] [address], [place], [place] 336-379-7146   °[facility] [address]  °[place], [place] 336-285-9073   °[facility] - Intensive Outpatient [address], [place], [place] 336-852-3033   °[facility] [address]  °[place], [place] 1-877-615-2722 or 336-784-9470   °[facility] [address] [place], [place] 336-227-7417 Accepts Medicaid  °[facility] [address]  °[place] 1-800-659-3381 Substance Abuse/Addiction Treatment  ° °[place] Behavioral Health Resources °Organization          Address  Phone  Notes  °[facility]  (888) 581-9988   °Julie Brannon, PhD [address] [place], [place]   (336) 349-5553 or (336) 951-0000   °[facility]   [address] °[place], [place] (336) 349-4454   °[facility] [address], [place], [place] (336) 342-8316 Insurance/Medicaid/sponsorship through [facility]  °[facility] [address]                                    [place], [place] (336) 342-8316 Therapy/tele-psych/case  °[facility] [address]  ° [place], [place] (336) 349-2233    °Dr. Arfeen  (336) 349-4544   °[facility] [place]  [employer] [place] [facility] 1) [address], [place] °2) [address], [place] °3)  [address], [place] (336) 349-3220 °(336) 342-7768 ° °(336) 342-8140   °[place] [facility] (336) 342-1394 or (336) 342-3537 (After Hours)    ° ° ° °

## 2014-12-04 NOTE — ED Notes (Signed)
Pt reports having asthma attack and is out of inhaler. spo2 98% at triage, no acute distress noted at triage.

## 2014-12-04 NOTE — ED Provider Notes (Signed)
CSN: [id number]     Arrival date & time 12/04/14  1233 History  By signing my name below, I, Placido Sou, attest that this documentation has been prepared under the direction and in the presence of [employer], PA-C. Electronically Signed: Placido Sou, ED Scribe. 12/04/2014. 12:57 PM.   Chief Complaint  Patient presents with  . Asthma   The history is provided by the patient. No language interpreter was used.    HPI Comments: Lawrence Gutierrez is a 29 y.o. male with a hx of asthma who presents to the Emergency Department complaining of an asthma exacerbation with onset last night. He notes an associated unproductive cough which began last night further noting that he ran out of his albuterol inhaler and requests a refill as well as a new prescription. He notes a hx of hospitalization for asthma but denies having ever been intubated. He denies fever, chills or other associated symptoms at this time.   Past Medical History  Diagnosis Date  . Asthma    History reviewed. No pertinent past surgical history. History reviewed. No pertinent family history. Social History  Substance Use Topics  . Smoking status: Never Smoker   . Smokeless tobacco: None  . Alcohol Use: Yes    Review of Systems  All other systems reviewed and are negative.  Allergies  Amoxicillin and Penicillins  Home Medications   Prior to Admission medications   Medication Sig Start Date End Date Taking? Authorizing Provider  acetaminophen (TYLENOL) 500 MG tablet Take 500 mg by mouth every 6 (six) hours as needed for headache.    Historical Provider, MD  albuterol (PROVENTIL HFA;VENTOLIN HFA) 108 (90 BASE) MCG/ACT inhaler Inhale 1-2 puffs into the lungs every 6 (six) hours as needed for wheezing or shortness of breath. 07/29/14   Trixie Dredge, PA-C  cyclobenzaprine (FLEXERIL) 5 MG tablet Take 5 mg by mouth 3 (three) times daily as needed for muscle spasms.    Historical Provider, MD  ibuprofen (ADVIL,MOTRIN)  200 MG tablet Take 200 mg by mouth every 6 (six) hours as needed for headache.    Historical Provider, MD  ibuprofen (ADVIL,MOTRIN) 800 MG tablet Take 1 tablet (800 mg total) by mouth 3 (three) times daily. 09/12/13   Ladona Mow, PA-C  predniSONE (STERAPRED UNI-PAK 21 TAB) 10 MG (21) TBPK tablet Take 1 tablet (10 mg total) by mouth daily. Day 1: take 6 tabs.  Day 2: 5 tabs  Day 3: 4 tabs  Day 4: 3 tabs  Day 5: 2 tabs  Day 6: 1 tab 07/29/14   Trixie Dredge, PA-C   BP 128/87 mmHg  Pulse 110  Temp(Src) 98.3 F (36.8 C) (Oral)  Resp 20  SpO2 96% Physical Exam  Constitutional: He is oriented to person, place, and time. He appears well-developed and well-nourished. No distress.  Sitting back comfortable and speaking in complete sentences  HENT:  Head: Normocephalic and atraumatic.  Mouth/Throat: Oropharynx is clear and moist. No oropharyngeal exudate.  Eyes: Conjunctivae are normal.  Neck: Normal range of motion. No JVD present. No tracheal deviation present.  Cardiovascular: Normal rate, regular rhythm and intact distal pulses.   Radial pulse equal bilaterally  Pulmonary/Chest: Effort normal. No stridor. No respiratory distress. He has wheezes. He has no rales. He exhibits no tenderness.  No stridor; moderate bilateral expiratory wheezing with prolonged expiration  Abdominal: Soft. He exhibits no distension and no mass. There is no tenderness. There is no rebound and no guarding.  Musculoskeletal: Normal range of motion.  He exhibits no edema or tenderness.  No calf asymmetry, superficial collaterals, palpable cords, edema, Homans sign negative bilaterally.    Neurological: He is alert and oriented to person, place, and time.  Skin: Skin is warm and dry. He is not diaphoretic.  Psychiatric: He has a normal mood and affect. His behavior is normal.  Nursing note and vitals reviewed.  ED Course  Procedures  DIAGNOSTIC STUDIES: Oxygen Saturation is 9% on RA, normal by my interpretation.     COORDINATION OF CARE: 12:45 PM Pt presents today due to an asthma exacerbation. Discussed treatment plan with pt and pt agreed to plan.   Labs Review Labs Reviewed - No data to display  Imaging Review No results found.   EKG Interpretation None      MDM   Final diagnoses:  Asthma exacerbation    Filed Vitals:   12/04/14 1239 12/04/14 1340  BP: 128/87 120/80  Pulse: 110 108  Temp: 98.3 F (36.8 C) 97.7 F (36.5 C)  TempSrc: Oral   Resp: 20 18  SpO2: 96% 95%    Medications  predniSONE (DELTASONE) tablet 60 mg (60 mg Oral Given 12/04/14 1252)  ipratropium-albuterol (DUONEB) 0.5-2.5 (3) MG/3ML nebulizer solution 3 mL (3 mLs Nebulization Given 12/04/14 1252)  albuterol (PROVENTIL HFA;VENTOLIN HFA) 108 (90 BASE) MCG/ACT inhaler 2 puff (2 puffs Inhalation Given 12/04/14 1325)    Lawrence Gutierrez is 29 y.o. male presenting with asthma exacerbation, no signs of systemic infection. Patient reports significant improvement after first nebulizer, recheck shows significant improvement in wheezing. Patient is requesting discharge. Handwritten prescription for nebulizer and patient encouraged to follow closely with primary care   Evaluation does not show pathology that would require ongoing emergent intervention or inpatient treatment. Pt is hemodynamically stable and mentating appropriately. Discussed findings and plan with patient/guardian, who agrees with care plan. All questions answered. Return precautions discussed and outpatient follow up given.   Discharge Medication List as of 12/04/2014  1:30 PM    START taking these medications   Details  albuterol (PROVENTIL) (2.5 MG/3ML) 0.083% nebulizer solution Take 3 mLs (2.5 mg total) by nebulization every 6 (six) hours as needed for wheezing or shortness of breath., Starting 12/04/2014, Until Discontinued, Print    predniSONE (DELTASONE) 20 MG tablet Take 2 tablets (40 mg total) by mouth daily., Starting 12/04/2014, Until  Discontinued, Print        I personally performed the services described in this documentation, which was scribed in my presence. The recorded information has been reviewed and is accurate.     Wynetta Emeryicole Nuel Dejaynes, PA-C 12/04/14 1618  Vanetta MuldersScott Zackowski, MD 12/05/14 [phone]

## 2014-12-27 ENCOUNTER — Emergency Department (HOSPITAL_COMMUNITY)
Admission: EM | Admit: 2014-12-27 | Discharge: 2014-12-27 | Disposition: A | Discharge status: Home / Self Care | Payer: Self-pay | Attending: Emergency Medicine | Admitting: Emergency Medicine

## 2014-12-27 ENCOUNTER — Encounter (HOSPITAL_COMMUNITY): Payer: Self-pay | Admitting: Emergency Medicine

## 2014-12-27 DIAGNOSIS — J4521 Mild intermittent asthma with (acute) exacerbation: Secondary | ICD-10-CM

## 2014-12-27 DIAGNOSIS — Z79899 Other long term (current) drug therapy: Secondary | ICD-10-CM | POA: Insufficient documentation

## 2014-12-27 DIAGNOSIS — Z880 Allergy status to penicillin: Secondary | ICD-10-CM | POA: Insufficient documentation

## 2014-12-27 MED ORDER — ALBUTEROL SULFATE HFA 108 (90 BASE) MCG/ACT IN AERS
2.0000 | INHALATION_SPRAY | RESPIRATORY_TRACT | Status: DC | PRN
  Administered 2014-12-27: 2 via RESPIRATORY_TRACT
  Filled 2014-12-27: qty 6.7

## 2014-12-27 MED ORDER — IPRATROPIUM-ALBUTEROL 0.5-2.5 (3) MG/3ML IN SOLN
3.0000 mL | Freq: Once | RESPIRATORY_TRACT | Status: AC
  Administered 2014-12-27: 3 mL via RESPIRATORY_TRACT
  Filled 2014-12-27: qty 3

## 2014-12-27 MED ORDER — ALBUTEROL SULFATE (2.5 MG/3ML) 0.083% IN NEBU: 5.0000 mg | INHALATION_SOLUTION | Freq: Once | RESPIRATORY_TRACT | Status: DC

## 2014-12-27 MED ORDER — PREDNISONE 20 MG PO TABS
60.0000 mg | ORAL_TABLET | Freq: Once | ORAL | Status: AC
  Administered 2014-12-27: 60 mg via ORAL
  Filled 2014-12-27: qty 3

## 2014-12-27 NOTE — Discharge Instructions (Signed)
Asthma, Adult Asthma is a recurring condition in which the airways tighten and narrow. Asthma can make it difficult to breathe. It can cause coughing, wheezing, and shortness of breath. Asthma episodes, also called asthma attacks, range from minor to life-threatening. Asthma cannot be cured, but medicines and lifestyle changes can help control it. CAUSES Asthma is believed to be caused by inherited (genetic) and environmental factors, but its exact cause is unknown. Asthma may be triggered by allergens, lung infections, or irritants in the air. Asthma triggers are different for each person. Common triggers include:   Animal dander.  Dust mites.  Cockroaches.  Pollen from trees or grass.  Mold.  Smoke.  Air pollutants such as dust, household cleaners, hair sprays, aerosol sprays, paint fumes, strong chemicals, or strong odors.  Cold air, weather changes, and winds (which increase molds and pollens in the air).  Strong emotional expressions such as crying or laughing hard.  Stress.  Certain medicines (such as aspirin) or types of drugs (such as beta-blockers).  Sulfites in foods and drinks. Foods and drinks that may contain sulfites include dried fruit, potato chips, and sparkling grape juice.  Infections or inflammatory conditions such as the flu, a cold, or an inflammation of the nasal membranes (rhinitis).  Gastroesophageal reflux disease (GERD).  Exercise or strenuous activity. SYMPTOMS Symptoms may occur immediately after asthma is triggered or many hours later. Symptoms include:  Wheezing.  Excessive nighttime or early morning coughing.  Frequent or severe coughing with a common cold.  Chest tightness.  Shortness of breath. DIAGNOSIS  The diagnosis of asthma is made by a review of your medical history and a physical exam. Tests may also be performed. These may include:  Lung function studies. These tests show how much air you breathe in and out.  Allergy  tests.  Imaging tests such as X-rays. TREATMENT  Asthma cannot be cured, but it can usually be controlled. Treatment involves identifying and avoiding your asthma triggers. It also involves medicines. There are 2 classes of medicine used for asthma treatment:   Controller medicines. These prevent asthma symptoms from occurring. They are usually taken every day.  Reliever or rescue medicines. These quickly relieve asthma symptoms. They are used as needed and provide short-term relief. Your health care provider will help you create an asthma action plan. An asthma action plan is a written plan for managing and treating your asthma attacks. It includes a list of your asthma triggers and how they may be avoided. It also includes information on when medicines should be taken and when their dosage should be changed. An action plan may also involve the use of a device called a peak flow meter. A peak flow meter measures how well the lungs are working. It helps you monitor your condition. HOME CARE INSTRUCTIONS   Take medicines only as directed by your health care provider. Speak with your health care provider if you have questions about how or when to take the medicines.  Use a peak flow meter as directed by your health care provider. Record and keep track of readings.  Understand and use the action plan to help minimize or stop an asthma attack without needing to seek medical care.  Control your home environment in the following ways to help prevent asthma attacks:  Do not smoke. Avoid being exposed to secondhand smoke.  Change your heating and air conditioning filter regularly.  Limit your use of fireplaces and wood stoves.  Get rid of pests (such as roaches   and mice) and their droppings.  Throw away plants if you see mold on them.  Clean your floors and dust regularly. Use unscented cleaning products.  Try to have someone else vacuum for you regularly. Stay out of rooms while they are  being vacuumed and for a short while afterward. If you vacuum, use a dust mask from a hardware store, a double-layered or microfilter vacuum cleaner bag, or a vacuum cleaner with a HEPA filter.  Replace carpet with wood, tile, or vinyl flooring. Carpet can trap dander and dust.  Use allergy-proof pillows, mattress covers, and box spring covers.  Wash bed sheets and blankets every week in hot water and dry them in a dryer.  Use blankets that are made of polyester or cotton.  Clean bathrooms and kitchens with bleach. If possible, have someone repaint the walls in these rooms with mold-resistant paint. Keep out of the rooms that are being cleaned and painted.  Wash hands frequently. SEEK MEDICAL CARE IF:   You have wheezing, shortness of breath, or a cough even if taking medicine to prevent attacks.  The colored mucus you cough up (sputum) is thicker than usual.  Your sputum changes from clear or white to yellow, green, gray, or bloody.  You have any problems that may be related to the medicines you are taking (such as a rash, itching, swelling, or trouble breathing).  You are using a reliever medicine more than 2-3 times per week.  Your peak flow is still at 50-79% of your personal best after following your action plan for 1 hour.  You have a fever. SEEK IMMEDIATE MEDICAL CARE IF:   You seem to be getting worse and are unresponsive to treatment during an asthma attack.  You are short of breath even at rest.  You get short of breath when doing very little physical activity.  You have difficulty eating, drinking, or talking due to asthma symptoms.  You develop chest pain.  You develop a fast heartbeat.  You have a bluish color to your lips or fingernails.  You are light-headed, dizzy, or faint.  Your peak flow is less than 50% of your personal best.   This information is not intended to replace advice given to you by your health care provider. Make sure you discuss any  questions you have with your health care provider.   Document Released: 01/01/2005 Document Revised: 09/22/2014 Document Reviewed: 07/31/2012 Elsevier Interactive Patient Education 2016 [employer]  

## 2014-12-27 NOTE — ED Notes (Signed)
Pt in from home reporting asthma exacerbation, has albuterol prescription just has not picked it up yet. Reports chest tightness, audible exp wheezing noted.

## 2014-12-27 NOTE — ED Provider Notes (Signed)
CSN: [id number]     Arrival date & time 12/27/14  0450 History   First MD Initiated Contact with Patient 12/27/14 0456     Chief Complaint  Patient presents with  . Asthma     (Consider location/radiation/quality/duration/timing/severity/associated sxs/prior Treatment) HPI Comments: Patient presents to the emergency department for evaluation of difficulty breathing. Patient reports that he does have a history of asthma. He started having difficulty breathing with wheezing tonight. He has run out of his albuterol. He denies any fever, cough or upper respiratory infection symptoms, but he does report that his son has a cold currently. There is no chest pain.  Patient is a 29 y.o. male presenting with asthma.  Asthma Associated symptoms include shortness of breath.    Past Medical History  Diagnosis Date  . Asthma    History reviewed. No pertinent past surgical history. No family history on file. Social History  Substance Use Topics  . Smoking status: Never Smoker   . Smokeless tobacco: None  . Alcohol Use: Yes    Review of Systems  Respiratory: Positive for shortness of breath and wheezing.   All other systems reviewed and are negative.     Allergies  Amoxicillin and Penicillins  Home Medications   Prior to Admission medications   Medication Sig Start Date End Date Taking? Authorizing Provider  acetaminophen (TYLENOL) 500 MG tablet Take 500 mg by mouth every 6 (six) hours as needed for headache.   Yes Historical Provider, MD  albuterol (PROVENTIL HFA;VENTOLIN HFA) 108 (90 BASE) MCG/ACT inhaler Inhale 2 puffs into the lungs every 4 (four) hours as needed for wheezing or shortness of breath. 12/04/14  Yes Nicole Pisciotta, PA-C  ibuprofen (ADVIL,MOTRIN) 200 MG tablet Take 200 mg by mouth every 6 (six) hours as needed for headache.   Yes Historical Provider, MD  albuterol (PROVENTIL) (2.5 MG/3ML) 0.083% nebulizer solution Take 3 mLs (2.5 mg total) by nebulization every 6  (six) hours as needed for wheezing or shortness of breath. Patient not taking: Reported on 12/27/2014 12/04/14   Joni Reining Pisciotta, PA-C   BP 128/84 mmHg  Pulse 92  Temp(Src) 97.5 F (36.4 C) (Oral)  Resp 21  Ht  (1.676 m)  Wt 160 lb (72.576 kg)  BMI 25.84 kg/m2  SpO2 94% Physical Exam  Constitutional: He is oriented to person, place, and time. He appears well-developed and well-nourished. No distress.  HENT:  Head: Normocephalic and atraumatic.  Right Ear: Hearing normal.  Left Ear: Hearing normal.  Nose: Nose normal.  Mouth/Throat: Oropharynx is clear and moist and mucous membranes are normal.  Eyes: Conjunctivae and EOM are normal. Pupils are equal, round, and reactive to light.  Neck: Normal range of motion. Neck supple.  Cardiovascular: Regular rhythm, S1 normal and S2 normal.  Exam reveals no gallop and no friction rub.   No murmur heard. Pulmonary/Chest: Accessory muscle usage present. Tachypnea noted. No respiratory distress. He has wheezes. He exhibits no tenderness.  Abdominal: Soft. Normal appearance and bowel sounds are normal. There is no hepatosplenomegaly. There is no tenderness. There is no rebound, no guarding, no tenderness at McBurney's point and negative Murphy's sign. No hernia.  Musculoskeletal: Normal range of motion.  Neurological: He is alert and oriented to person, place, and time. He has normal strength. No cranial nerve deficit or sensory deficit. Coordination normal. GCS eye subscore is 4. GCS verbal subscore is 5. GCS motor subscore is 6.  Skin: Skin is warm, dry and intact. No rash noted. No  cyanosis.  Psychiatric: He has a normal mood and affect. His speech is normal and behavior is normal. Thought content normal.  Nursing note and vitals reviewed.   ED Course  Procedures (including critical care time) Labs Review Labs Reviewed - No data to display  Imaging Review No results found. I have personally reviewed and evaluated these images and  lab results as part of my medical decision-making.   EKG Interpretation   Date/Time:  Monday December 27 2014 04:58:13 EST Ventricular Rate:  96 PR Interval:  167 QRS Duration: 89 QT Interval:  367 QTC Calculation: 464 R Axis:   91 Text Interpretation:  Sinus rhythm Borderline right axis deviation  Baseline wander in lead(s) II aVR No previous tracing Confirmed by POLLINA   MD, CHRISTOPHER ([id number]) on 12/27/2014 5:07:42 AM      MDM   Final diagnoses:  None  asthma   Presents to the emergency department for evaluation of difficulty breathing. Patient has a history of asthma. He has been out of his medications. Patient has had improvement with tubular treatments here in the ER. He will be discharged, continue albuterol, prednisone.    Gilda Creasehristopher J Pollina, MD 12/27/14 [phone]

## 2015-01-23 ENCOUNTER — Encounter (HOSPITAL_COMMUNITY): Payer: Self-pay

## 2015-01-23 ENCOUNTER — Emergency Department (HOSPITAL_COMMUNITY)
Admission: EM | Admit: 2015-01-23 | Discharge: 2015-01-23 | Disposition: A | Discharge status: Home / Self Care | Payer: Self-pay | Attending: Emergency Medicine | Admitting: Emergency Medicine

## 2015-01-23 DIAGNOSIS — J4521 Mild intermittent asthma with (acute) exacerbation: Secondary | ICD-10-CM | POA: Insufficient documentation

## 2015-01-23 DIAGNOSIS — Z880 Allergy status to penicillin: Secondary | ICD-10-CM | POA: Insufficient documentation

## 2015-01-23 DIAGNOSIS — R000 Tachycardia, unspecified: Secondary | ICD-10-CM | POA: Insufficient documentation

## 2015-01-23 MED ORDER — PREDNISONE 20 MG PO TABS: ORAL_TABLET | ORAL | Status: DC

## 2015-01-23 MED ORDER — ALBUTEROL SULFATE (2.5 MG/3ML) 0.083% IN NEBU
INHALATION_SOLUTION | RESPIRATORY_TRACT | Status: DC
Start: 2015-01-23 — End: 2015-01-24
  Filled 2015-01-23: qty 6

## 2015-01-23 MED ORDER — ALBUTEROL SULFATE (2.5 MG/3ML) 0.083% IN NEBU
5.0000 mg | INHALATION_SOLUTION | Freq: Once | RESPIRATORY_TRACT | Status: AC
  Administered 2015-01-23: 5 mg via RESPIRATORY_TRACT

## 2015-01-23 MED ORDER — PREDNISONE 20 MG PO TABS
60.0000 mg | ORAL_TABLET | Freq: Once | ORAL | Status: AC
  Administered 2015-01-23: 60 mg via ORAL
  Filled 2015-01-23: qty 3

## 2015-01-23 MED ORDER — ALBUTEROL SULFATE HFA 108 (90 BASE) MCG/ACT IN AERS
2.0000 | INHALATION_SPRAY | RESPIRATORY_TRACT | Status: DC | PRN
  Administered 2015-01-23: 2 via RESPIRATORY_TRACT
  Filled 2015-01-23: qty 6.7

## 2015-01-23 NOTE — ED Notes (Signed)
Stats remained above 97% on room air while ambulating

## 2015-01-23 NOTE — ED Notes (Signed)
Onset today cough, shortness of breath.   Pt ran out of inhaler tonight, pharmacy not open.  Talking in complete sentences.

## 2015-01-23 NOTE — Discharge Instructions (Signed)
Asthma, Acute Bronchospasm °Acute bronchospasm caused by asthma is also referred to as an asthma attack. Bronchospasm means your air passages become narrowed. The narrowing is caused by inflammation and tightening of the muscles in the air tubes (bronchi) in your lungs. This can make it hard to breathe or cause you to wheeze and cough. °CAUSES °Possible triggers are: °· Animal dander from the skin, hair, or feathers of animals. °· Dust mites contained in house dust. °· Cockroaches. °· Pollen from trees or grass. °· Mold. °· Cigarette or tobacco smoke. °· Air pollutants such as dust, household cleaners, hair sprays, aerosol sprays, paint fumes, strong chemicals, or strong odors. °· Cold air or weather changes. Cold air may trigger inflammation. Winds increase molds and pollens in the air. °· Strong emotions such as crying or laughing hard. °· Stress. °· Certain medicines such as aspirin or beta-blockers. °· Sulfites in foods and drinks, such as dried fruits and wine. °· Infections or inflammatory conditions, such as a flu, cold, or inflammation of the nasal membranes (rhinitis). °· Gastroesophageal reflux disease (GERD). GERD is a condition where stomach acid backs up into your esophagus. °· Exercise or strenuous activity. °SIGNS AND SYMPTOMS  °· Wheezing. °· Excessive coughing, particularly at night. °· Chest tightness. °· Shortness of breath. °DIAGNOSIS  °Your health care provider will ask you about your medical history and perform a physical exam. A chest X-ray or blood testing may be performed to look for other causes of your symptoms or other conditions that may have triggered your asthma attack.  °TREATMENT  °Treatment is aimed at reducing inflammation and opening up the airways in your lungs.  Most asthma attacks are treated with inhaled medicines. These include quick relief or rescue medicines (such as bronchodilators) and controller medicines (such as inhaled corticosteroids). These medicines are sometimes  given through an inhaler or a nebulizer. Systemic steroid medicine taken by mouth or given through an IV tube also can be used to reduce the inflammation when an attack is moderate or severe. Antibiotic medicines are only used if a bacterial infection is present.  °HOME CARE INSTRUCTIONS  °· Rest. °· Drink plenty of liquids. This helps the mucus to remain thin and be easily coughed up. Only use caffeine in moderation and do not use alcohol until you have recovered from your illness. °· Do not smoke. Avoid being exposed to secondhand smoke. °· You play a critical role in keeping yourself in good health. Avoid exposure to things that cause you to wheeze or to have breathing problems. °· Keep your medicines up-to-date and available. Carefully follow your health care provider's treatment plan. °· Take your medicine exactly as prescribed. °· When pollen or pollution is bad, keep windows closed and use an air conditioner or go to places with air conditioning. °· Asthma requires careful medical care. See your health care provider for a follow-up as advised. If you are more than [redacted] weeks pregnant and you were prescribed any new medicines, let your obstetrician know about the visit and how you are doing. Follow up with your health care provider as directed. °· After you have recovered from your asthma attack, make an appointment with your outpatient doctor to talk about ways to reduce the likelihood of future attacks. If you do not have a doctor who manages your asthma, make an appointment with a primary care doctor to discuss your asthma. °SEEK IMMEDIATE MEDICAL CARE IF:  °· You are getting worse. °· You have trouble breathing. If severe, call your local   emergency services (911 in the [place] °· You develop chest pain or discomfort. °· You are vomiting. °· You are not able to keep fluids down. °· You are coughing up yellow, green, brown, or bloody sputum. °· You have a fever and your symptoms suddenly get worse. °· You have  trouble swallowing. °MAKE SURE YOU:  °· Understand these instructions. °· Will watch your condition. °· Will get help right away if you are not doing well or get worse. °  °This information is not intended to replace advice given to you by your health care provider. Make sure you discuss any questions you have with your health care provider. °  °Document Released: 04/18/2006 Document Revised: 01/06/2013 Document Reviewed: 07/09/2012 °Elsevier Interactive Patient Education ©2016 Elsevier Inc. ° ° °Emergency Department Resource Guide °1) Find a Doctor and Pay Out of Pocket °Although you won't have to find out who is covered by your insurance plan, it is a good idea to ask around and get recommendations. You will then need to call the office and see if the doctor you have chosen will accept you as a new patient and what types of options they offer for patients who are self-pay. Some doctors offer discounts or will set up payment plans for their patients who do not have insurance, but you will need to ask so you aren't surprised when you get to your appointment. ° °2) Contact Your Local Health Department °Not all health departments have doctors that can see patients for sick visits, but many do, so it is worth a call to see if yours does. If you don't know where your local health department is, you can check in your phone book. The CDC also has a tool to help you locate your state's health department, and many state websites also have listings of all of their local health departments. ° °3) Find a Walk-in Clinic °If your illness is not likely to be very severe or complicated, you may want to try a walk in clinic. These are popping up all over the country in pharmacies, drugstores, and shopping centers. They're usually staffed by nurse practitioners or physician assistants that have been trained to treat common illnesses and complaints. They're usually fairly quick and inexpensive. However, if you have serious medical  issues or chronic medical problems, these are probably not your best option. ° °No Primary Care Doctor: °- Call [facility] at  832-8000 - they can help you locate a primary care doctor that  accepts your insurance, provides certain services, etc. °- Physician Referral Service- 1-800-533-3463 ° °Chronic Pain Problems: °Organization         Address  Phone   Notes  °[facility]  (336) 297-2271 Patients need to be referred by their primary care doctor.  ° °Medication Assistance: °Organization         Address  Phone   Notes  °[place] [facility] [address] °[place], [place] [address] (336) 641-8030 --Must be a resident of [place] °-- Must have NO insurance coverage whatsoever (no Medicaid/ Medicare, etc.) °-- The pt. MUST have a primary care doctor that directs their care regularly and follows them in the community °  °[facility]  (866) 331-1348   °[employer]  (888) 892-1162   ° °Agencies that provide inexpensive medical care: °Organization         Address  Phone   Notes  °[facility]  (336) 832-8035   °[facility]    (336) 832-7272   °[facility] [address] °[place], [place] [address] (336) 832-4777   °[facility] [address], °[place] (336) 271-4999   °[facility]    (336) 373-0678   °[facility]    (336) 272-1050   °[facility] ° [address], [place] Phone:  (336) 832-4444, Fax:  (336) 832-4440 Hours of Operation:  9 am - 6 pm, M-F.  Also accepts Medicaid/Medicare and self-pay.  °[facility] ° [address], [place] Phone: (336) 832-3150, Fax: (336) 832-3151. Hours of Operation:  8:30 am - 5:30 pm, M-F.  Also accepts Medicaid and self-pay.  °[facility] [place] [address], [place] Phone: (336) 878-6027   °[facility] [address], [place], [place]  (336)723-1848, Ext. 123 Mondays & Thursdays: 7-9 AM.  First 15 patients are seen on a first come, first serve basis. °  ° °Medicaid-accepting [place] Providers: ° °Organization         Address  Phone   Notes  °[facility] [address], [place] (336) 641-2100 Also accepts self-pay patients.  °[facility] [address], [place] ° (336) 856-9996   °[facility] [address], [place] (336) 288-8857   °[facility] [address] [place] [address], [place] (336) 299-7000   °Veita Bland [address], [place]  ° (336) 373-1557 Only accepts [place] Access Medicaid patients after they have their name applied to their card.  ° °Self-Pay (no insurance) in [place]: ° °Organization         Address  Phone   Notes  °Sickle Cell Patients, [facility] [address], [place] (336) 832-1970   °[facility] [address], [place] (336) 832-4400   °[facility] ° [address],  (336) 992-4800   °[facility]/Dr. Osei-Bonsu ° [address] [place] [address], [place] or [address], [place] (336) [phone] Phone number for both [place] and [place] locations is the same.  °[facility] [address], [place] (336) 299-0000   °[facility] [address] [place] [address], [place] or [address] (336) 852-7530 °(336) 878-2260   °[facility] [address],  (336) 350-1642, phone; (336) 294-5005, fax Sees patients 1st and 3rd Saturday of every month.  Must not qualify for public or private insurance (i.e. Medicaid, Medicare, [place] Health Choice, Veterans' Benefits) • Household income should be no more than 200% of the poverty level •The clinic cannot treat you if you are pregnant or think you are pregnant • Sexually transmitted  diseases are not treated at the clinic.  ° ° °Dental Care: °Organization         Address  Phone  Notes  °[facility] [address],  (336) 641-6152 Accepts children up to age 21 who are enrolled in Medicaid or [place] Health Choice; pregnant women with a Medicaid card; and children who have applied for Medicaid or [place] Health Choice, but were declined, whose parents can pay a reduced fee at time of service.  °[facility] [place]  [address], [place] (336) 641-7733 Accepts children up to age 21 who are enrolled in Medicaid or [place] Health Choice; pregnant women with a Medicaid card; and children who have applied for Medicaid or [place] Health Choice, but were declined, whose parents can pay a reduced fee at time of service.  °[facility] ° [address], [place] (336) 641-4533 Patients are seen by appointment only. Walk-ins are not accepted. [facility] will see patients 18 years of age and older. °Monday - Tuesday (8am-5pm) °Most Wednesdays (8:30-5pm) °$30 per visit, cash only  °[facility] ° [address], [place] (336) 641-4533 Patients are seen by appointment only. Walk-ins are not accepted. [facility] will see patients 18 years of age and older. °One Wednesday Evening (Monthly: Volunteer Based).  $30 per visit, cash only  °[facility]  (919) 537-3737 for adults; Children under age 4, call Graduate Pediatric Dentistry at (919) [phone]. Children aged 4-14, please call (919) [phone] to request a pediatric application. ° Dental services are provided in all areas of dental care including fillings, crowns and bridges, complete and partial dentures, implants, gum treatment, root canals, and extractions. Preventive care is also provided. Treatment is provided to both adults and children. °Patients are selected via a  lottery and there is often a waiting list. °  °[facility] [address], °[place] ° (336) 763-8833 [email or web address] °  °[facility] [address], [place], [place] (336)723-1848, Ext. 123 Second and Fourth Thursday of each month, opens at 6:30 AM; Clinic ends at 9 AM.  Patients are seen on a first-come first-served basis, and a limited number are seen during each clinic.  ° °[facility] ° [address], [place], [place] (336) 723-7904   Eligibility Requirements °You must have lived in [place], [place], or [place] counties for at least the last three months. °  You cannot be eligible for state or federal sponsored healthcare insurance, including Veterans Administration, Medicaid, or Medicare. °  You generally cannot be eligible for healthcare insurance through your employer.  °  How to apply: °Eligibility screenings are held every Tuesday and Wednesday afternoon from 1:00 pm until 4:00 pm. You do not need an appointment for the interview!  °[facility] [address], [place], [place] 336-631-2330   °[facility]  336-342-8273   °[facility]  336-703-3100   °[facility]  336-570-6415   ° °Behavioral Health Resources in the Community: °Intensive Outpatient Programs °Organization         Address  Phone  Notes  °[facility] [address], [place], [place] 336-878-6098   °[facility] [address], [place], [place] 336-832-9800   °[facility] [address], [place],  ° 336-882-2125   °[facility] [address],  °[place],  1-800-853-5163 or 336-641-4981   °Substance Abuse Resources °Organization         Address  Phone  Notes  °[facility]  336-882-2125   °[facility]  336-784-9470   °[facility]  336-285-9073   °[facility]  336-845-3988   °Residential &  Outpatient Substance Abuse Program  1-800-659-3381   °Psychological Services °Organization         Address  Phone  Notes  °[facility]  336- 832-9600   °[facility]  336- 378-7881   °  [facility] [address], [place] 1-800-853-5163 or 336-641-4981   ° °Mobile Crisis Teams °Organization         Address  Phone  Notes  °[facility], Mobile Crisis Care Unit  1-877-626-1772   °Assertive °[facility] ° [address] [place], [place] 336-834-9664   °Sharon DeEsch [address] °[place] 336-554-5454   ° °Self-Help/Support Groups °Organization         Address  Phone             Notes  °[facility] [place] - variety of support groups  336- 373-1402 Call for more information  °Narcotics Anonymous (NA), [facility] [address], °[place]  2 meetings at this location  ° °Residential Treatment Programs °Organization         Address  Phone  Notes  °[facility] [address],    °[place]  1-866-801-8205   °[facility] ° [address], [place], [place] 704-293-8524   °[facility] [address], [place] 336-845-3988 Admissions: 8am-3pm M-F  °[facility] [address]    °[place], [place] 336-841-1104   °[facility] [address], [place], [place] 336-379-7146   °[facility] [address]  °[place], [place] 336-285-9073   °[facility] Intensive Outpatient [address], [place], [place] 336-852-3033   °[facility] [address]  °[place], [place] 1-877-615-2722 or 336-784-9470   °[facility] [address] [place], [place] 336-227-7417 Accepts Medicaid  °[facility] [address]  °[place] 1-800-659-3381 Substance Abuse/Addiction Treatment  ° °[place] Behavioral Health Resources °Organization          Address  Phone  Notes  °[facility]  (888) 581-9988   °Julie Brannon, PhD [address] [place], [place]   (336) 349-5553 or (336) 951-0000   °[facility]   [address] °[place], [place] (336) 349-4454   °[facility] [address], [place], [place] (336) 342-8316 Insurance/Medicaid/sponsorship through [facility]  °[facility] [address]                                    [place], [place] (336) 342-8316 Therapy/tele-psych/case  °[facility] [address]  ° [place], [place] (336) 349-2233    °Dr. Arfeen  (336) 349-4544   °[facility] [place]  [employer] [place] [facility] 1) [address], [place] °2) [address], [place] °3)  [address], [place] (336) 349-3220 °(336) 342-7768 ° °(336) 342-8140   °[place] [facility] (336) 342-1394 or (336) 342-3537 (After Hours)    ° ° ° °

## 2015-01-23 NOTE — ED Provider Notes (Signed)
CSN: [id number]     Arrival date & time 01/23/15  2007 History   First MD Initiated Contact with Patient 01/23/15 2030     Chief Complaint  Patient presents with  . Asthma     (Consider location/radiation/quality/duration/timing/severity/associated sxs/prior Treatment) HPI   30 year old male with history of asthma who presents for evaluation of shortness of breath and wheezing. Patient states symptoms started earlier today which include increased shortness of breath and wheezing similar to his prior asthma attack. He attributed to possible allergies to unknown substance. He ran out of his inhaler last night and did try to get a refill today at the store but it was closed. He denies any recent sick contact. Denies having fever, running nose sneezing sore throat or ear pain. No active chest pain abdominal pain nausea vomiting or diarrhea. No rash. Remote history of ICU stay when he was young. Patient usually uses his inhaler twice daily.  Past Medical History  Diagnosis Date  . Asthma    History reviewed. No pertinent past surgical history. History reviewed. No pertinent family history. Social History  Substance Use Topics  . Smoking status: Never Smoker   . Smokeless tobacco: None  . Alcohol Use: Yes    Review of Systems  All other systems reviewed and are negative.     Allergies  Amoxicillin and Penicillins  Home Medications   Prior to Admission medications   Medication Sig Start Date End Date Taking? Authorizing Provider  acetaminophen (TYLENOL) 500 MG tablet Take 500 mg by mouth every 6 (six) hours as needed for headache.    Historical Provider, MD  albuterol (PROVENTIL HFA;VENTOLIN HFA) 108 (90 BASE) MCG/ACT inhaler Inhale 2 puffs into the lungs every 4 (four) hours as needed for wheezing or shortness of breath. 12/04/14   Nicole Pisciotta, PA-C  albuterol (PROVENTIL) (2.5 MG/3ML) 0.083% nebulizer solution Take 3 mLs (2.5 mg total) by nebulization every 6 (six) hours as  needed for wheezing or shortness of breath. Patient not taking: Reported on 12/27/2014 12/04/14   Joni ReiningNicole Pisciotta, PA-C  ibuprofen (ADVIL,MOTRIN) 200 MG tablet Take 200 mg by mouth every 6 (six) hours as needed for headache.    Historical Provider, MD   BP 114/78 mmHg  Pulse 123  Temp(Src) 97.8 F (36.6 C) (Oral)  Resp 16  SpO2 95% Physical Exam  Constitutional: He appears well-developed and well-nourished. No distress.  Asian male, appears to be in no acute respiratory distress.  HENT:  Head: Atraumatic.  Right Ear: External ear normal.  Left Ear: External ear normal.  Nose: Nose normal.  Mouth/Throat: Oropharynx is clear and moist. No oropharyngeal exudate.  Eyes: Conjunctivae are normal.  Neck: Normal range of motion. Neck supple. No tracheal deviation present.  Cardiovascular: Intact distal pulses.   Tachycardia without murmurs or gallops  Pulmonary/Chest: No stridor.  Able to  speaking in complete sentence. Mild tachypnea, decreased lung sounds with inspiratory and expiratory wheezes along with prolonged expiratory phases. No rhonchi or rales.   Abdominal: Soft. There is no tenderness.  Musculoskeletal: He exhibits no edema.  Neurological: He is alert.  Skin: No rash noted.  Psychiatric: He has a normal mood and affect.  Nursing note and vitals reviewed.   ED Course  Procedures (including critical care time)   MDM   Final diagnoses:  Asthma exacerbation attacks, mild intermittent    BP 127/100 mmHg  Pulse 107  Temp(Src) 97.8 F (36.6 C) (Oral)  Resp 22  SpO2 94%   8:40 PM Patient  presents with an asthma exacerbation when he ran out of his albuterol inhaler. No recent sickness, and patient is in no acute respiratory distress. Treatment provided.  9:18 PM Pt felt much better after breathing treatment.  Lung sounds clear on reexamination.  He is able to ambulate without respiratory distress.  O2 sats 97% on RA.  Pt d/c with albuterol MDI and prednisone.   Return precaution discussed.   Fayrene Helper, PA-C 01/23/15 2119  [place] Fossa, MD 01/24/15 [phone]

## 2015-04-26 ENCOUNTER — Emergency Department (HOSPITAL_COMMUNITY)
Admission: EM | Admit: 2015-04-26 | Discharge: 2015-04-26 | Disposition: A | Discharge status: Home / Self Care | Payer: PRIVATE HEALTH INSURANCE | Attending: Emergency Medicine | Admitting: Emergency Medicine

## 2015-04-26 ENCOUNTER — Encounter (HOSPITAL_COMMUNITY): Payer: Self-pay | Admitting: *Deleted

## 2015-04-26 DIAGNOSIS — J45909 Unspecified asthma, uncomplicated: Secondary | ICD-10-CM

## 2015-04-26 DIAGNOSIS — J45901 Unspecified asthma with (acute) exacerbation: Secondary | ICD-10-CM | POA: Insufficient documentation

## 2015-04-26 DIAGNOSIS — Z79899 Other long term (current) drug therapy: Secondary | ICD-10-CM | POA: Insufficient documentation

## 2015-04-26 DIAGNOSIS — Z880 Allergy status to penicillin: Secondary | ICD-10-CM | POA: Insufficient documentation

## 2015-04-26 MED ORDER — ALBUTEROL SULFATE (2.5 MG/3ML) 0.083% IN NEBU
INHALATION_SOLUTION | RESPIRATORY_TRACT | Status: AC
  Filled 2015-04-26: qty 6

## 2015-04-26 MED ORDER — ALBUTEROL SULFATE HFA 108 (90 BASE) MCG/ACT IN AERS
1.0000 | INHALATION_SPRAY | Freq: Once | RESPIRATORY_TRACT | Status: AC
  Administered 2015-04-26: 1 via RESPIRATORY_TRACT
  Filled 2015-04-26: qty 6.7

## 2015-04-26 MED ORDER — PREDNISONE 20 MG PO TABS: 60.0000 mg | ORAL_TABLET | Freq: Every day | ORAL | Status: DC

## 2015-04-26 MED ORDER — IPRATROPIUM-ALBUTEROL 0.5-2.5 (3) MG/3ML IN SOLN
3.0000 mL | Freq: Once | RESPIRATORY_TRACT | Status: AC
  Administered 2015-04-26: 3 mL via RESPIRATORY_TRACT
  Filled 2015-04-26: qty 3

## 2015-04-26 MED ORDER — PREDNISONE 20 MG PO TABS
60.0000 mg | ORAL_TABLET | Freq: Once | ORAL | Status: AC
  Administered 2015-04-26: 60 mg via ORAL
  Filled 2015-04-26: qty 3

## 2015-04-26 MED ORDER — ALBUTEROL SULFATE HFA 108 (90 BASE) MCG/ACT IN AERS: 1.0000 | INHALATION_SPRAY | Freq: Four times a day (QID) | RESPIRATORY_TRACT | Status: DC | PRN

## 2015-04-26 MED ORDER — ALBUTEROL SULFATE (2.5 MG/3ML) 0.083% IN NEBU
5.0000 mg | INHALATION_SOLUTION | Freq: Once | RESPIRATORY_TRACT | Status: AC
  Administered 2015-04-26: 5 mg via RESPIRATORY_TRACT

## 2015-04-26 NOTE — ED Notes (Signed)
PA Riley at bedside.  

## 2015-04-26 NOTE — Discharge Instructions (Signed)
[name],  Nice meeting you! Please follow-up with your primary care provider. Return to the emergency department if you develop increased shortness of breath, chest pain, new/worsening symptoms. Feel better soon!  S. Lane Hacker, PA-C  Asthma, Adult Asthma is a recurring condition in which the airways tighten and narrow. Asthma can make it difficult to breathe. It can cause coughing, wheezing, and shortness of breath. Asthma episodes, also called asthma attacks, range from minor to life-threatening. Asthma cannot be cured, but medicines and lifestyle changes can help control it. CAUSES Asthma is believed to be caused by inherited (genetic) and environmental factors, but its exact cause is unknown. Asthma may be triggered by allergens, lung infections, or irritants in the air. Asthma triggers are different for each person. Common triggers include:   Animal dander.  Dust mites.  Cockroaches.  Pollen from trees or grass.  Mold.  Smoke.  Air pollutants such as dust, household cleaners, hair sprays, aerosol sprays, paint fumes, strong chemicals, or strong odors.  Cold air, weather changes, and winds (which increase molds and pollens in the air).  Strong emotional expressions such as crying or laughing hard.  Stress.  Certain medicines (such as aspirin) or types of drugs (such as beta-blockers).  Sulfites in foods and drinks. Foods and drinks that may contain sulfites include dried fruit, potato chips, and sparkling grape juice.  Infections or inflammatory conditions such as the flu, a cold, or an inflammation of the nasal membranes (rhinitis).  Gastroesophageal reflux disease (GERD).  Exercise or strenuous activity. SYMPTOMS Symptoms may occur immediately after asthma is triggered or many hours later. Symptoms include:  Wheezing.  Excessive nighttime or early morning coughing.  Frequent or severe coughing with a common cold.  Chest tightness.  Shortness  of breath. DIAGNOSIS  The diagnosis of asthma is made by a review of your medical history and a physical exam. Tests may also be performed. These may include:  Lung function studies. These tests show how much air you breathe in and out.  Allergy tests.  Imaging tests such as X-rays. TREATMENT  Asthma cannot be cured, but it can usually be controlled. Treatment involves identifying and avoiding your asthma triggers. It also involves medicines. There are 2 classes of medicine used for asthma treatment:   Controller medicines. These prevent asthma symptoms from occurring. They are usually taken every day.  Reliever or rescue medicines. These quickly relieve asthma symptoms. They are used as needed and provide short-term relief. Your health care provider will help you create an asthma action plan. An asthma action plan is a written plan for managing and treating your asthma attacks. It includes a list of your asthma triggers and how they may be avoided. It also includes information on when medicines should be taken and when their dosage should be changed. An action plan may also involve the use of a device called a peak flow meter. A peak flow meter measures how well the lungs are working. It helps you monitor your condition. HOME CARE INSTRUCTIONS   Take medicines only as directed by your health care provider. Speak with your health care provider if you have questions about how or when to take the medicines.  Use a peak flow meter as directed by your health care provider. Record and keep track of readings.  Understand and use the action plan to help minimize or stop an asthma attack without needing to seek medical care.  Control your home environment in the following ways to help  prevent asthma attacks:  Do not smoke. Avoid being exposed to secondhand smoke.  Change your heating and air conditioning filter regularly.  Limit your use of fireplaces and wood stoves.  Get rid of pests (such  as roaches and mice) and their droppings.  Throw away plants if you see mold on them.  Clean your floors and dust regularly. Use unscented cleaning products.  Try to have someone else vacuum for you regularly. Stay out of rooms while they are being vacuumed and for a short while afterward. If you vacuum, use a dust mask from a hardware store, a double-layered or microfilter vacuum cleaner bag, or a vacuum cleaner with a HEPA filter.  Replace carpet with wood, tile, or vinyl flooring. Carpet can trap dander and dust.  Use allergy-proof pillows, mattress covers, and box spring covers.  Wash bed sheets and blankets every week in hot water and dry them in a dryer.  Use blankets that are made of polyester or cotton.  Clean bathrooms and kitchens with bleach. If possible, have someone repaint the walls in these rooms with mold-resistant paint. Keep out of the rooms that are being cleaned and painted.  Wash hands frequently. SEEK MEDICAL CARE IF:   You have wheezing, shortness of breath, or a cough even if taking medicine to prevent attacks.  The colored mucus you cough up (sputum) is thicker than usual.  Your sputum changes from clear or white to yellow, green, gray, or bloody.  You have any problems that may be related to the medicines you are taking (such as a rash, itching, swelling, or trouble breathing).  You are using a reliever medicine more than 2-3 times per week.  Your peak flow is still at 50-79% of your personal best after following your action plan for 1 hour.  You have a fever. SEEK IMMEDIATE MEDICAL CARE IF:   You seem to be getting worse and are unresponsive to treatment during an asthma attack.  You are short of breath even at rest.  You get short of breath when doing very little physical activity.  You have difficulty eating, drinking, or talking due to asthma symptoms.  You develop chest pain.  You develop a fast heartbeat.  You have a bluish color to your  lips or fingernails.  You are light-headed, dizzy, or faint.  Your peak flow is less than 50% of your personal best.   This information is not intended to replace advice given to you by your health care provider. Make sure you discuss any questions you have with your health care provider.   Document Released: 01/01/2005 Document Revised: 09/22/2014 Document Reviewed: 07/31/2012 Elsevier Interactive Patient Education [employer] Inc[employer]

## 2015-04-26 NOTE — ED Notes (Signed)
Pt ran out of asthma inhaler yesterday and is here with audible wheezing.

## 2015-04-30 NOTE — ED Provider Notes (Signed)
CSN: [id number]     Arrival date & time 04/26/15  [id number] History   First MD Initiated Contact with Patient 04/26/15 [phone]     Chief Complaint  Patient presents with  . Asthma   HPI   Hamilton CapriJoshua Hinson is a 30 y.o. male PMH significant for asthma presenting with a request for an inhaler (ran out yesterday). He endorses slight SOB today and states this is usual for his asthma exacerbations, and they usually occur when the weather changes, worsened with seasonal allergies. He denies fevers, chills, CP, abdominal pain, N/V, changes in bowel/bladder habits.   Past Medical History  Diagnosis Date  . Asthma    History reviewed. No pertinent past surgical history. No family history on file. Social History  Substance Use Topics  . Smoking status: Never Smoker   . Smokeless tobacco: None  . Alcohol Use: Yes    Review of Systems  Ten systems are reviewed and are negative for acute change except as noted in the HPI  Allergies  Amoxicillin and Penicillins  Home Medications   Prior to Admission medications   Medication Sig Start Date End Date Taking? Authorizing Provider  cetirizine (ZYRTEC) 10 MG tablet Take 10 mg by mouth daily.   Yes Historical Provider, MD  acetaminophen (TYLENOL) 500 MG tablet Take 500 mg by mouth every 6 (six) hours as needed for headache.    Historical Provider, MD  albuterol (PROVENTIL HFA;VENTOLIN HFA) 108 (90 Base) MCG/ACT inhaler Inhale 1-2 puffs into the lungs every 6 (six) hours as needed for wheezing or shortness of breath. 04/26/15   Melton KrebsSamantha Nicole Ruven Corradi, PA-C  ibuprofen (ADVIL,MOTRIN) 200 MG tablet Take 200 mg by mouth every 6 (six) hours as needed for headache.    Historical Provider, MD  predniSONE (DELTASONE) 20 MG tablet Take 3 tablets (60 mg total) by mouth daily. 04/26/15   Melton KrebsSamantha Nicole Jadalee Westcott, PA-C   BP 120/83 mmHg  Pulse 80  Temp(Src) 97.6 F (36.4 C) (Oral)  Resp 20  SpO2 96% Physical Exam  Constitutional: He appears well-developed and  well-nourished. No distress.  HENT:  Head: Normocephalic and atraumatic.  Mouth/Throat: Oropharynx is clear and moist. No oropharyngeal exudate.  Eyes: Conjunctivae are normal. Pupils are equal, round, and reactive to light. Right eye exhibits no discharge. Left eye exhibits no discharge. No scleral icterus.  Neck: No tracheal deviation present.  Cardiovascular: Normal rate, regular rhythm, normal heart sounds and intact distal pulses.  Exam reveals no gallop and no friction rub.   No murmur heard. Pulmonary/Chest: Effort normal. No respiratory distress. He has wheezes. He has no rales. He exhibits no tenderness.  Lung sounds diminished BL with end expiratory wheezes.   Abdominal: Soft. Bowel sounds are normal. He exhibits no distension and no mass. There is no tenderness. There is no rebound and no guarding.  Musculoskeletal: He exhibits no edema.  Lymphadenopathy:    He has no cervical adenopathy.  Neurological: He is alert. Coordination normal.  Skin: Skin is warm and dry. No rash noted. He is not diaphoretic. No erythema.  Psychiatric: He has a normal mood and affect. His behavior is normal.  Nursing note and vitals reviewed.   ED Course  Procedures   MDM   Final diagnoses:  Asthma, unspecified asthma severity, uncomplicated   Patient nontoxic appearing, VSS. Patient ambulated in ED with O2 saturations maintained >90, no current signs of respiratory distress. Lung exam improved after nebulizer treatment. Prednisone given in the ED and pt will be discharged with  5 day burst and inhaler refill. Pt states they are breathing at baseline. Pt has been instructed to continue using prescribed medications and to speak with PCP about today's exacerbation.   Melton Krebs, PA-C 04/30/15 1610  Derwood Kaplan, MD 05/03/15 [phone]

## 2015-07-14 ENCOUNTER — Emergency Department (HOSPITAL_COMMUNITY)
Admission: EM | Admit: 2015-07-14 | Discharge: 2015-07-14 | Disposition: A | Discharge status: Home / Self Care | Payer: PRIVATE HEALTH INSURANCE | Attending: Emergency Medicine | Admitting: Emergency Medicine

## 2015-07-14 ENCOUNTER — Encounter (HOSPITAL_COMMUNITY): Payer: Self-pay

## 2015-07-14 DIAGNOSIS — J45901 Unspecified asthma with (acute) exacerbation: Secondary | ICD-10-CM | POA: Diagnosis present

## 2015-07-14 DIAGNOSIS — Z79899 Other long term (current) drug therapy: Secondary | ICD-10-CM | POA: Insufficient documentation

## 2015-07-14 MED ORDER — PREDNISONE 20 MG PO TABS
60.0000 mg | ORAL_TABLET | Freq: Once | ORAL | Status: AC
  Administered 2015-07-14: 60 mg via ORAL
  Filled 2015-07-14: qty 3

## 2015-07-14 MED ORDER — ALBUTEROL SULFATE (2.5 MG/3ML) 0.083% IN NEBU
5.0000 mg | INHALATION_SOLUTION | Freq: Once | RESPIRATORY_TRACT | Status: AC
  Administered 2015-07-14: 5 mg via RESPIRATORY_TRACT

## 2015-07-14 MED ORDER — ALBUTEROL SULFATE (2.5 MG/3ML) 0.083% IN NEBU
INHALATION_SOLUTION | RESPIRATORY_TRACT | Status: AC
  Filled 2015-07-14: qty 3

## 2015-07-14 MED ORDER — ALBUTEROL SULFATE HFA 108 (90 BASE) MCG/ACT IN AERS
2.0000 | INHALATION_SPRAY | Freq: Once | RESPIRATORY_TRACT | Status: AC
  Administered 2015-07-14: 2 via RESPIRATORY_TRACT
  Filled 2015-07-14: qty 6.7

## 2015-07-14 MED ORDER — ALBUTEROL SULFATE (2.5 MG/3ML) 0.083% IN NEBU: 5.0000 mg | INHALATION_SOLUTION | Freq: Four times a day (QID) | RESPIRATORY_TRACT | Status: DC | PRN

## 2015-07-14 MED ORDER — PREDNISONE 20 MG PO TABS: 60.0000 mg | ORAL_TABLET | Freq: Every day | ORAL | Status: DC

## 2015-07-14 NOTE — ED Notes (Signed)
Pt started having asthma attack, and SOB started last night. Audible wheezing. Stats 95%

## 2015-07-14 NOTE — Discharge Instructions (Signed)
Asthma Attack Prevention Lawrence Gutierrez, take prednisone for the next 4 days and use albuterol as needed.  See a primary care doctor within 3 days for close follow up. If symptoms worsen, come back to the ED immediately. Thank you. While you may not be able to control the fact that you have asthma, you can take actions to prevent asthma attacks. The best way to prevent asthma attacks is to maintain good control of your asthma. You can achieve this by:  Taking your medicines as directed.  Avoiding things that can irritate your airways or make your asthma symptoms worse (asthma triggers).  Keeping track of how well your asthma is controlled and of any changes in your symptoms.  Responding quickly to worsening asthma symptoms (asthma attack).  Seeking emergency care when it is needed. WHAT ARE SOME WAYS TO PREVENT AN ASTHMA ATTACK? Have a Plan Work with your health care provider to create a written plan for managing and treating your asthma attacks (asthma action plan). This plan includes:  A list of your asthma triggers and how you can avoid them.  Information on when medicines should be taken and when their dosages should be changed.  The use of a device that measures how well your lungs are working (peak flow meter). Monitor Your Asthma Use your peak flow meter and record your results in a journal every day. A drop in your peak flow numbers on one or more days may indicate the start of an asthma attack. This can happen even before you start to feel symptoms. You can prevent an asthma attack from getting worse by following the steps in your asthma action plan. Avoid Asthma Triggers Work with your asthma health care provider to find out what your asthma triggers are. This can be done by:  Allergy testing.  Keeping a journal that notes when asthma attacks occur and the factors that may have contributed to them.  Determining if there are other medical conditions that are making your  asthma worse. Once you have determined your asthma triggers, take steps to avoid them. This may include avoiding excessive or prolonged exposure to:  Dust. Have someone dust and vacuum your home for you once or twice a week. Using a high-efficiency particulate arrestance (HEPA) vacuum is best.  Smoke. This includes campfire smoke, forest fire smoke, and secondhand smoke from tobacco products.  Pet dander. Avoid contact with animals that you know you are allergic to.  Allergens from trees, grasses or pollens. Avoid spending a lot of time outdoors when pollen counts are high, and on very windy days.  Very cold, dry, or humid air.  Mold.  Foods that contain high amounts of sulfites.  Strong odors.  Outdoor air pollutants, such as Museum/gallery exhibitions officerengine exhaust.  Indoor air pollutants, such as aerosol sprays and fumes from household cleaners.  Household pests, including dust mites and cockroaches, and pest droppings.  Certain medicines, including NSAIDs. Always talk to your health care provider before stopping or starting any new medicines. Medicines Take over-the-counter and prescription medicines only as told by your health care provider. Many asthma attacks can be prevented by carefully following your medicine schedule. Taking your medicines correctly is especially important when you cannot avoid certain asthma triggers. Act Quickly If an asthma attack does happen, acting quickly can decrease how severe it is and how long it lasts. Take these steps:   Pay attention to your symptoms. If you are coughing, wheezing, or having difficulty breathing, do not wait to see  if your symptoms go away on their own. Follow your asthma action plan.  If you have followed your asthma action plan and your symptoms are not improving, call your health care provider or seek immediate medical care at the nearest hospital. It is important to note how often you need to use your fast-acting rescue inhaler. If you are using  your rescue inhaler more often, it may mean that your asthma is not under control. Adjusting your asthma treatment plan may help you to prevent future asthma attacks and help you to gain better control of your condition. HOW CAN I PREVENT AN ASTHMA ATTACK WHEN I EXERCISE? Follow advice from your health care provider about whether you should use your fast-acting inhaler before exercising. Many people with asthma experience exercise-induced bronchoconstriction (EIB). This condition often worsens during vigorous exercise in cold, humid, or dry environments. Usually, people with EIB can stay very active by pre-treating with a fast-acting inhaler before exercising.   This information is not intended to replace advice given to you by your health care provider. Make sure you discuss any questions you have with your health care provider.   Document Released: 12/20/2008 Document Revised: 09/22/2014 Document Reviewed: 06/03/2014 Elsevier Interactive Patient Education [employer] Inc.

## 2015-07-14 NOTE — ED Provider Notes (Signed)
CSN: [id number]     Arrival date & time 07/14/15  0507 History   First MD Initiated Contact with Patient 07/14/15 0518     Chief Complaint  Patient presents with  . 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 Patient states he has had a bad allergy season and he had worsening shortness of breath and wheezy breathing last night. He uses albuterol inhaler but ran out. He denies any fevers, cough, recent infections. He's had no sick contacts. Patient is denying any chest pain. There are no further complaints.    10 Systems reviewed and are negative for acute change except as noted in the HPI.     Past Medical History  Diagnosis Date  . Asthma    History reviewed. No pertinent past surgical history. No family history on file. Social History  Substance Use Topics  . Smoking status: Never Smoker   . Smokeless tobacco: None  . Alcohol Use: Yes    Review of Systems    Allergies  Amoxicillin and Penicillins  Home Medications   Prior to Admission medications   Medication Sig Start Date End Date Taking? Authorizing Provider  albuterol (PROVENTIL) (2.5 MG/3ML) 0.083% nebulizer solution Take 6 mLs (5 mg total) by nebulization every 6 (six) hours as needed for wheezing or shortness of breath. 07/14/15   Tomasita CrumbleAdeleke Shaunta Oncale, MD  predniSONE (DELTASONE) 20 MG tablet Take 3 tablets (60 mg total) by mouth daily. 07/14/15   Tomasita CrumbleAdeleke Esthefany Herrig, MD   BP 107/67 mmHg  Pulse 81  Temp(Src) 98.4 F (36.9 C) (Oral)  Resp 26  Ht 5\' 6"  (1.676 m)  Wt 165 lb (74.844 kg)  BMI 26.64 kg/m2  SpO2 98% Physical Exam  Constitutional: He is oriented to person, place, and time. Vital signs are normal. He appears well-developed and well-nourished.  Non-toxic appearance. He does not appear ill. No distress.  HENT:  Head: Normocephalic and  atraumatic.  Nose: Nose normal.  Mouth/Throat: Oropharynx is clear and moist. No oropharyngeal exudate.  Eyes: Conjunctivae and EOM are normal. Pupils are equal, round, and reactive to light. No scleral icterus.  Neck: Normal range of motion. Neck supple. No tracheal deviation, no edema, no erythema and normal range of motion present. No thyroid mass and no thyromegaly present.  Cardiovascular: Normal rate, regular rhythm, S1 normal, S2 normal, normal heart sounds, intact distal pulses and normal pulses.  Exam reveals no gallop and no friction rub.   No murmur heard. Pulmonary/Chest: Effort normal and breath sounds normal. No respiratory distress. He has no wheezes. He has no rhonchi. He has no rales.  Abdominal: Soft. Normal appearance and bowel sounds are normal. He exhibits no distension, no ascites and no mass. There is no hepatosplenomegaly. There is no tenderness. There is no rebound, no guarding and no CVA tenderness.  Musculoskeletal: Normal range of motion. He exhibits no edema or tenderness.  Lymphadenopathy:    He has no cervical adenopathy.  Neurological: He is alert and oriented to person, place, and time. He has normal strength. No cranial nerve deficit or sensory deficit.  Skin: Skin is warm, dry and intact. No petechiae and no rash noted. He is not diaphoretic. No erythema. No pallor.  Psychiatric: He has a normal mood and affect. His behavior is normal. Judgment normal.  Nursing note and vitals reviewed.   ED Course  Procedures (including critical care time) Labs Review  Labs Reviewed - No data to display  Imaging Review No results found. I have personally reviewed and evaluated these images and lab results as part of my medical decision-making.   EKG Interpretation None      MDM   Final diagnoses:  Asthma exacerbation  Patient presents emergency department for asthma exacerbation. He is usually done and on my evaluation and there was no wheezing. Will finish the  nap, give prednisone for 5 days as well as albuterol inhaler to go home with. Patient oxygen saturation is 100% on room air throughout my history and physical exam. He appears well in no acute distress, vital signs were within his normal limits and he is safe for discharge.    Tomasita CrumbleAdeleke Morgann Woodburn, MD 07/14/15 [phone]

## 2015-09-04 ENCOUNTER — Encounter (HOSPITAL_COMMUNITY): Payer: Self-pay | Admitting: Emergency Medicine

## 2015-09-04 DIAGNOSIS — Z760 Encounter for issue of repeat prescription: Secondary | ICD-10-CM | POA: Insufficient documentation

## 2015-09-04 DIAGNOSIS — J45901 Unspecified asthma with (acute) exacerbation: Secondary | ICD-10-CM | POA: Insufficient documentation

## 2015-09-04 MED ORDER — ALBUTEROL SULFATE (2.5 MG/3ML) 0.083% IN NEBU
5.0000 mg | INHALATION_SOLUTION | Freq: Once | RESPIRATORY_TRACT | Status: AC
  Administered 2015-09-05: 5 mg via RESPIRATORY_TRACT

## 2015-09-04 NOTE — ED Triage Notes (Signed)
Pt. reports asthma attack this evening with wheezing /productive cough and mild chest congestion , pt. stated he ran out of his inhaler at home .

## 2015-09-05 ENCOUNTER — Emergency Department (HOSPITAL_COMMUNITY)
Admission: EM | Admit: 2015-09-05 | Discharge: 2015-09-05 | Disposition: A | Discharge status: Home / Self Care | Payer: PRIVATE HEALTH INSURANCE | Attending: Emergency Medicine | Admitting: Emergency Medicine

## 2015-09-05 DIAGNOSIS — Z760 Encounter for issue of repeat prescription: Secondary | ICD-10-CM

## 2015-09-05 DIAGNOSIS — J45901 Unspecified asthma with (acute) exacerbation: Secondary | ICD-10-CM

## 2015-09-05 MED ORDER — PREDNISONE 20 MG PO TABS: 40.0000 mg | ORAL_TABLET | Freq: Every day | ORAL | 0 refills | Status: DC

## 2015-09-05 MED ORDER — FLUTICASONE PROPIONATE 50 MCG/ACT NA SUSP: 2.0000 | Freq: Every day | NASAL | 2 refills | Status: DC

## 2015-09-05 MED ORDER — ALBUTEROL SULFATE HFA 108 (90 BASE) MCG/ACT IN AERS: 2.0000 | INHALATION_SPRAY | RESPIRATORY_TRACT | 3 refills | Status: DC | PRN

## 2015-09-05 MED ORDER — PREDNISONE 20 MG PO TABS
60.0000 mg | ORAL_TABLET | Freq: Once | ORAL | Status: AC
  Administered 2015-09-05: 60 mg via ORAL
  Filled 2015-09-05: qty 3

## 2015-09-05 MED ORDER — ALBUTEROL SULFATE HFA 108 (90 BASE) MCG/ACT IN AERS
2.0000 | INHALATION_SPRAY | RESPIRATORY_TRACT | Status: DC | PRN
  Administered 2015-09-05: 2 via RESPIRATORY_TRACT
  Filled 2015-09-05: qty 6.7

## 2015-09-05 MED ORDER — ALBUTEROL SULFATE (2.5 MG/3ML) 0.083% IN NEBU
INHALATION_SOLUTION | RESPIRATORY_TRACT | Status: AC
  Filled 2015-09-05: qty 6

## 2015-09-05 MED ORDER — AEROCHAMBER PLUS W/MASK MISC
1.0000 | Freq: Once | Status: AC
  Administered 2015-09-05: 1
  Filled 2015-09-05: qty 1

## 2015-09-05 NOTE — ED Provider Notes (Signed)
[facility] Provider Note   CSN: [id number] Arrival date & time: 09/04/15  2352     History   Chief Complaint Chief Complaint  Patient presents with  . Asthma    HPI Lawrence Gutierrez is a 30 y.o. male.  Lawrence CapriJoshua Hoelzer is a 30 y.o. male  with a hx of asthma, allergies presents to the Emergency Department complaining of gradual, persistent, progressively worsening chest tightness and wheezing onset this evening.  Pt reports he has had increasing chest tightness for several days, but ran out of his inhaler 24 hours ago.  He reports significant wheezing tonight.  He states he has nebulizer solution at home but does not have the DME to use it.  Pt denies recent illness, fever, chills.  He reports he has not been taking his Zyrtec.  He notes that mold and cats often cause an exacerbation.  He is staying with a friend who has a cat.     The history is provided by the patient and medical records. No language interpreter was used.    Past Medical History:  Diagnosis Date  . Asthma     There are no active problems to display for this patient.   History reviewed. No pertinent surgical history.     Home Medications    Prior to Admission medications   Medication Sig Start Date End Date Taking? Authorizing Provider  albuterol (PROVENTIL HFA;VENTOLIN HFA) 108 (90 Base) MCG/ACT inhaler Inhale 2 puffs into the lungs every 4 (four) hours as needed for wheezing or shortness of breath. 09/05/15   Lisbeth Puller, PA-C  albuterol (PROVENTIL) (2.5 MG/3ML) 0.083% nebulizer solution Take 6 mLs (5 mg total) by nebulization every 6 (six) hours as needed for wheezing or shortness of breath. 07/14/15   Tomasita CrumbleAdeleke Oni, MD  fluticasone (FLONASE) 50 MCG/ACT nasal spray Place 2 sprays into both nostrils daily. 09/05/15   Zafirah Vanzee, PA-C  predniSONE (DELTASONE) 20 MG tablet Take 2 tablets (40 mg total) by mouth daily. 09/05/15   Dahlia ClientHannah Donis Pinder, PA-C    Family History No  family history on file.  Social History Social History  Substance Use Topics  . Smoking status: Never Smoker  . Smokeless tobacco: Not on file  . Alcohol use Yes     Allergies   Amoxicillin and Penicillins   Review of Systems Review of Systems  Respiratory: Positive for cough, chest tightness, shortness of breath and wheezing.   All other systems reviewed and are negative.    Physical Exam Updated Vital Signs BP 124/85 (BP Location: Left Arm)   Pulse 88   Temp 98 F (36.7 C) (Oral)   Resp 16   Ht 5\' 6"  (1.676 m)   Wt 76.7 kg   SpO2 99%   BMI 27.28 kg/m   Physical Exam  Constitutional: He appears well-developed and well-nourished. No distress.  Awake, alert, nontoxic appearance  HENT:  Head: Normocephalic and atraumatic.  Right Ear: Tympanic membrane, external ear and ear canal normal.  Left Ear: Tympanic membrane, external ear and ear canal normal.  Nose: No mucosal edema or rhinorrhea. No epistaxis. Right sinus exhibits no maxillary sinus tenderness and no frontal sinus tenderness. Left sinus exhibits no maxillary sinus tenderness and no frontal sinus tenderness.  Mouth/Throat: Uvula is midline, oropharynx is clear and moist and mucous membranes are normal. Mucous membranes are not pale and not cyanotic. No oropharyngeal exudate, posterior oropharyngeal edema, posterior oropharyngeal erythema or tonsillar abscesses.  Eyes: Conjunctivae are normal. Pupils are equal, round, and  reactive to light. No scleral icterus.  Neck: Normal range of motion and full passive range of motion without pain. Neck supple.  Cardiovascular: Normal rate, regular rhythm and intact distal pulses.   Pulmonary/Chest: Effort normal and breath sounds normal. No stridor. No respiratory distress. He has no decreased breath sounds. He has no wheezes. He has no rhonchi.  Equal chest expansion Clear and equal breath sounds - post nebulizer treatment  Abdominal: Soft. Bowel sounds are normal. He  exhibits no mass. There is no tenderness. There is no rebound and no guarding.  Musculoskeletal: Normal range of motion. He exhibits no edema.  Lymphadenopathy:    He has no cervical adenopathy.  Neurological: He is alert.  Speech is clear and goal oriented Moves extremities without ataxia  Skin: Skin is warm and dry. No rash noted. He is not diaphoretic.  Psychiatric: He has a normal mood and affect.  Nursing note and vitals reviewed.    ED Treatments / Results  Labs (all labs ordered are listed, but only abnormal results are displayed) Labs Reviewed - No data to display  EKG  EKG Interpretation None       Radiology No results found.  Procedures Procedures (including critical care time)  Medications Ordered in ED Medications  albuterol (PROVENTIL HFA;VENTOLIN HFA) 108 (90 Base) MCG/ACT inhaler 2 puff (not administered)  aerochamber plus with mask device 1 each (not administered)  predniSONE (DELTASONE) tablet 60 mg (not administered)  albuterol (PROVENTIL) (2.5 MG/3ML) 0.083% nebulizer solution 5 mg (5 mg Nebulization Given 09/05/15 0001)     Initial Impression / Assessment and Plan / ED Course  I have reviewed the triage vital signs and the nursing notes.  Pertinent labs & imaging results that were available during my care of the patient were reviewed by me and considered in my medical decision making (see chart for details).  Clinical Course  Value Comment By Time  SpO2: 99 % No hypoxia Dierdre ForthHannah Sebrina Kessner, PA-C 08/21 0128    Patient ambulated in ED with O2 saturations maintained >90, no current signs of respiratory distress. Lung exam improved after nebulizer treatment. Prednisone given in the ED and pt will be dc with 5 day burst. Pt states they are breathing at baseline. Pt has been instructed to continue using prescribed medications and to speak with PCP about today's exacerbation.   Final Clinical Impressions(s) / ED Diagnoses   Final diagnoses:  Asthma  exacerbation  Medication refill    New Prescriptions New Prescriptions   ALBUTEROL (PROVENTIL HFA;VENTOLIN HFA) 108 (90 BASE) MCG/ACT INHALER    Inhale 2 puffs into the lungs every 4 (four) hours as needed for wheezing or shortness of breath.   FLUTICASONE (FLONASE) 50 MCG/ACT NASAL SPRAY    Place 2 sprays into both nostrils daily.   PREDNISONE (DELTASONE) 20 MG TABLET    Take 2 tablets (40 mg total) by mouth daily.     Dahlia ClientHannah Quintavia Rogstad, PA-C 09/05/15 0133    Lyndal Pulleyaniel Knott, MD 09/05/15 0230

## 2015-09-05 NOTE — Discharge Instructions (Signed)
1. Medications: albuterol, prednisone, flonase, usual home medications °2. Treatment: rest, drink plenty of fluids, begin OTC antihistamine (Zyrtec or Claritin) °3. Follow Up: Please followup with your primary doctor in 2-3 days for discussion of your diagnoses and further evaluation after today's visit; if you do not have a primary care doctor use the resource guide provided to find one; Please return to the ER for difficulty breathing, high fevers or worsening symptoms. ° °

## 2015-11-30 ENCOUNTER — Encounter (HOSPITAL_COMMUNITY): Payer: Self-pay

## 2015-11-30 ENCOUNTER — Emergency Department (HOSPITAL_COMMUNITY)
Admission: EM | Admit: 2015-11-30 | Discharge: 2015-11-30 | Disposition: A | Discharge status: Home / Self Care | Payer: BLUE CROSS/BLUE SHIELD | Attending: Emergency Medicine | Admitting: Emergency Medicine

## 2015-11-30 ENCOUNTER — Emergency Department (HOSPITAL_COMMUNITY): Payer: BLUE CROSS/BLUE SHIELD

## 2015-11-30 DIAGNOSIS — J45901 Unspecified asthma with (acute) exacerbation: Secondary | ICD-10-CM

## 2015-11-30 DIAGNOSIS — J40 Bronchitis, not specified as acute or chronic: Secondary | ICD-10-CM | POA: Diagnosis not present

## 2015-11-30 DIAGNOSIS — R0602 Shortness of breath: Secondary | ICD-10-CM | POA: Diagnosis present

## 2015-11-30 DIAGNOSIS — J4551 Severe persistent asthma with (acute) exacerbation: Secondary | ICD-10-CM | POA: Insufficient documentation

## 2015-11-30 LAB — COMPREHENSIVE METABOLIC PANEL
ALT: 18 U/L (ref 17–63)
ANION GAP: 11 (ref 5–15)
AST: 26 U/L (ref 15–41)
Albumin: 4.3 g/dL (ref 3.5–5.0)
Alkaline Phosphatase: 64 U/L (ref 38–126)
BUN: 6 mg/dL (ref 6–20)
CHLORIDE: 104 mmol/L (ref 101–111)
CO2: 23 mmol/L (ref 22–32)
CREATININE: 1.04 mg/dL (ref 0.61–1.24)
Calcium: 9.6 mg/dL (ref 8.9–10.3)
Glucose, Bld: 104 mg/dL — ABNORMAL HIGH (ref 65–99)
POTASSIUM: 3.5 mmol/L (ref 3.5–5.1)
SODIUM: 138 mmol/L (ref 135–145)
Total Bilirubin: 1.4 mg/dL — ABNORMAL HIGH (ref 0.3–1.2)
Total Protein: 7.9 g/dL (ref 6.5–8.1)

## 2015-11-30 LAB — CBC WITH DIFFERENTIAL/PLATELET
Basophils Absolute: 0 10*3/uL (ref 0.0–0.1)
Basophils Relative: 0 %
EOS ABS: 0.5 10*3/uL (ref 0.0–0.7)
EOS PCT: 3 %
HCT: 43.9 % (ref 39.0–52.0)
Hemoglobin: 15.3 g/dL (ref 13.0–17.0)
LYMPHS ABS: 1.2 10*3/uL (ref 0.7–4.0)
LYMPHS PCT: 8 %
MCH: 30.3 pg (ref 26.0–34.0)
MCHC: 34.9 g/dL (ref 30.0–36.0)
MCV: 86.9 fL (ref 78.0–100.0)
MONO ABS: 0.9 10*3/uL (ref 0.1–1.0)
MONOS PCT: 6 %
Neutro Abs: 11.8 10*3/uL — ABNORMAL HIGH (ref 1.7–7.7)
Neutrophils Relative %: 83 %
PLATELETS: 259 10*3/uL (ref 150–400)
RBC: 5.05 MIL/uL (ref 4.22–5.81)
RDW: 12.5 % (ref 11.5–15.5)
WBC: 14.3 10*3/uL — AB (ref 4.0–10.5)

## 2015-11-30 MED ORDER — FLUTICASONE PROPIONATE HFA 110 MCG/ACT IN AERO: 2.0000 | INHALATION_SPRAY | Freq: Two times a day (BID) | RESPIRATORY_TRACT | 12 refills | Status: DC

## 2015-11-30 MED ORDER — PREDNISONE 20 MG PO TABS: ORAL_TABLET | ORAL | 0 refills | Status: DC

## 2015-11-30 MED ORDER — LEVOFLOXACIN IN D5W 750 MG/150ML IV SOLN
750.0000 mg | Freq: Once | INTRAVENOUS | Status: AC
  Administered 2015-11-30: 750 mg via INTRAVENOUS
  Filled 2015-11-30: qty 150

## 2015-11-30 MED ORDER — LEVALBUTEROL HCL 0.63 MG/3ML IN NEBU
INHALATION_SOLUTION | RESPIRATORY_TRACT | Status: AC
  Filled 2015-11-30: qty 12

## 2015-11-30 MED ORDER — LEVALBUTEROL HCL 0.63 MG/3ML IN NEBU
2.5200 mg | INHALATION_SOLUTION | Freq: Once | RESPIRATORY_TRACT | Status: AC
  Administered 2015-11-30: 2.52 mg via RESPIRATORY_TRACT

## 2015-11-30 MED ORDER — METHYLPREDNISOLONE SODIUM SUCC 125 MG IJ SOLR
125.0000 mg | Freq: Once | INTRAMUSCULAR | Status: AC
  Administered 2015-11-30: 125 mg via INTRAVENOUS
  Filled 2015-11-30: qty 2

## 2015-11-30 MED ORDER — SODIUM CHLORIDE 0.9 % IV BOLUS (SEPSIS)
1000.0000 mL | Freq: Once | INTRAVENOUS | Status: AC
  Administered 2015-11-30: 1000 mL via INTRAVENOUS

## 2015-11-30 MED ORDER — LEVALBUTEROL HCL 0.63 MG/3ML IN NEBU
0.6300 mg | INHALATION_SOLUTION | Freq: Once | RESPIRATORY_TRACT | Status: AC
  Administered 2015-11-30: 0.63 mg via RESPIRATORY_TRACT
  Filled 2015-11-30: qty 3

## 2015-11-30 MED ORDER — ALBUTEROL SULFATE HFA 108 (90 BASE) MCG/ACT IN AERS
1.0000 | INHALATION_SPRAY | RESPIRATORY_TRACT | Status: DC | PRN
  Administered 2015-11-30: 2 via RESPIRATORY_TRACT
  Filled 2015-11-30: qty 6.7

## 2015-11-30 MED ORDER — LEVOFLOXACIN 750 MG PO TABS: 750.0000 mg | ORAL_TABLET | Freq: Every day | ORAL | 0 refills | Status: DC

## 2015-11-30 MED ORDER — GUAIFENESIN 100 MG/5ML PO LIQD: 100.0000 mg | ORAL | 0 refills | Status: DC | PRN

## 2015-11-30 NOTE — ED Provider Notes (Signed)
[facility] Provider Note   CSN: [id number] Arrival date & time: 11/30/15  0946     History   Chief Complaint Chief Complaint  Patient presents with  . Shortness of Breath    HPI Lawrence Gutierrez is a 30 y.o. male.  HPI Patient presents with one week of increasing shortness of breath and wheezing. Acutely worsened over the last day. He's had cough has been nonproductive. Denies fever or chills. Denies any new lower extremity swelling or pain. States has a history of asthma from childhood. Was intubated for asthma exacerbation as a child. Has been using his rescue inhaler multiple times. He is not on a daily steroid inhaler. Past Medical History:  Diagnosis Date  . Asthma     There are no active problems to display for this patient.   History reviewed. No pertinent surgical history.     Home Medications    Prior to Admission medications   Medication Sig Start Date End Date Taking? Authorizing Provider  albuterol (PROVENTIL HFA;VENTOLIN HFA) 108 (90 Base) MCG/ACT inhaler Inhale 2 puffs into the lungs every 4 (four) hours as needed for wheezing or shortness of breath. 09/05/15   Hannah Muthersbaugh, PA-C  albuterol (PROVENTIL) (2.5 MG/3ML) 0.083% nebulizer solution Take 6 mLs (5 mg total) by nebulization every 6 (six) hours as needed for wheezing or shortness of breath. 07/14/15   Tomasita CrumbleAdeleke Oni, MD  fluticasone (FLONASE) 50 MCG/ACT nasal spray Place 2 sprays into both nostrils daily. 09/05/15   Hannah Muthersbaugh, PA-C  fluticasone (FLOVENT HFA) 110 MCG/ACT inhaler Inhale 2 puffs into the lungs 2 (two) times daily. 11/30/15   Loren Raceravid Shogo Larkey, MD  guaiFENesin (ROBITUSSIN) 100 MG/5ML liquid Take 5-10 mLs (100-200 mg total) by mouth every 4 (four) hours as needed for cough. 11/30/15   Loren Raceravid Zykiria Bruening, MD  levofloxacin (LEVAQUIN) 750 MG tablet Take 1 tablet (750 mg total) by mouth daily. 12/01/15   Loren Raceravid Lyrical Sowle, MD  predniSONE (DELTASONE) 20 MG tablet 3 tabs po day one,  then 2 po daily x 4 days 11/30/15   Loren Raceravid Takiesha Mcdevitt, MD    Family History No family history on file.  Social History Social History  Substance Use Topics  . Smoking status: Never Smoker  . Smokeless tobacco: Not on file  . Alcohol use Yes     Allergies   Amoxicillin and Penicillins   Review of Systems Review of Systems  Constitutional: Negative for chills and fever.  HENT: Negative for congestion, rhinorrhea and sore throat.   Respiratory: Positive for cough, shortness of breath and wheezing.   Cardiovascular: Negative for chest pain, palpitations and leg swelling.  Gastrointestinal: Negative for abdominal pain, nausea and vomiting.  Musculoskeletal: Negative for back pain, myalgias, neck pain and neck stiffness.  Skin: Negative for rash and wound.  Neurological: Negative for dizziness, weakness, light-headedness, numbness and headaches.  All other systems reviewed and are negative.    Physical Exam Updated Vital Signs BP 128/74   Pulse 111   Temp 99 F (37.2 C) (Oral)   Resp 23   SpO2 97%   Physical Exam  Constitutional: He is oriented to person, place, and time. He appears well-developed and well-nourished.  HENT:  Head: Normocephalic and atraumatic.  Mouth/Throat: Oropharynx is clear and moist. No oropharyngeal exudate.  Eyes: EOM are normal. Pupils are equal, round, and reactive to light.  Neck: Normal range of motion. Neck supple. No JVD present.  Cardiovascular: Normal rate and regular rhythm.   Pulmonary/Chest: Effort normal and breath sounds  normal.  Diminished breath sounds throughout with expiratory wheezing in the bilateral bases  Abdominal: Soft. Bowel sounds are normal. There is no tenderness. There is no rebound and no guarding.  Musculoskeletal: Normal range of motion. He exhibits no edema or tenderness.  No lower extremity swelling, asymmetry or tenderness. Distal pulses intact.  Neurological: He is alert and oriented to person, place, and time.    Moves all extremities without deficit. Sensation is fully intact.  Skin: Skin is warm and dry. Capillary refill takes less than 2 seconds. No rash noted. No erythema.  Psychiatric: He has a normal mood and affect. His behavior is normal.  Nursing note and vitals reviewed.    ED Treatments / Results  Labs (all labs ordered are listed, but only abnormal results are displayed) Labs Reviewed  CBC WITH DIFFERENTIAL/PLATELET - Abnormal; Notable for the following:       Result Value   WBC 14.3 (*)    Neutro Abs 11.8 (*)    All other components within normal limits  COMPREHENSIVE METABOLIC PANEL - Abnormal; Notable for the following:    Glucose, Bld 104 (*)    Total Bilirubin 1.4 (*)    All other components within normal limits    EKG  EKG Interpretation None       Radiology Dg Chest Port 1 View  Result Date: 11/30/2015 CLINICAL DATA:  Shortness of breath over the last 2 days. Chest congestion. EXAM: PORTABLE CHEST 1 VIEW COMPARISON:  07/29/2014 FINDINGS: Heart size is normal. Mediastinal shadows are normal. Chronically prominent interstitial markings without evidence of infiltrate, collapse or effusion. No abnormal bone finding. IMPRESSION: Chronic bronchial thickening/ interstitial prominence. No infiltrate, collapse or effusion. Electronically Signed   By: Paulina Fusi M.D.   On: 11/30/2015 10:45    Procedures Procedures (including critical care time)  Medications Ordered in ED Medications  methylPREDNISolone sodium succinate (SOLU-MEDROL) 125 mg/2 mL injection 125 mg (125 mg Intravenous Given 11/30/15 1019)  levalbuterol (XOPENEX) nebulizer solution 0.63 mg (0.63 mg Nebulization Given 11/30/15 1019)  sodium chloride 0.9 % bolus 1,000 mL (0 mLs Intravenous Stopped 11/30/15 1151)  levalbuterol (XOPENEX) nebulizer solution 2.52 mg (2.52 mg Nebulization Given 11/30/15 1049)  levofloxacin (LEVAQUIN) IVPB 750 mg (0 mg Intravenous Stopped 11/30/15 1454)  levalbuterol (XOPENEX)  nebulizer solution 0.63 mg (0.63 mg Nebulization Given 11/30/15 1250)  sodium chloride 0.9 % bolus 1,000 mL (0 mLs Intravenous Stopped 11/30/15 1454)     Initial Impression / Assessment and Plan / ED Course  I have reviewed the triage vital signs and the nursing notes.  Pertinent labs & imaging results that were available during my care of the patient were reviewed by me and considered in my medical decision making (see chart for details).  Clinical Course     Heart rate in the 130s will give Xopenex and albuterol. Also give IV fluids and screen for pneumonia with chest x-ray. Patient states he is feeling much better. Speaking in full sentences. No obvious distress. Few scattered expiratory wheezes though significantly improved. Saturations over 90% on room air. Heart rate remains mildly elevated which is most likely due to nebulized treatments. Have recommended admission but patient is refusing. Given IV antibiotics in the emergency department for presumed bronchitis/pneumonia. Patient understands the need to return immediately for any worsening of his symptoms or concerns. Final Clinical Impressions(s) / ED Diagnoses   Final diagnoses:  Severe asthma with exacerbation, unspecified whether persistent  Bronchitis    New Prescriptions Discharge Medication List as  of 11/30/2015  2:50 PM    START taking these medications   Details  fluticasone (FLOVENT HFA) 110 MCG/ACT inhaler Inhale 2 puffs into the lungs 2 (two) times daily., Starting Wed 11/30/2015, Print    levofloxacin (LEVAQUIN) 750 MG tablet Take 1 tablet (750 mg total) by mouth daily., Starting Thu 12/01/2015, Print         Loren Raceravid Eeshan Verbrugge, MD 12/01/15 [phone]

## 2015-11-30 NOTE — ED Triage Notes (Signed)
Patient complains of increased cough with wheezing and shortness of breath x 1 week. Has asthma and sats 89% on arrival. Used nebs hourly throughout the night. Unable to speak complete sentences

## 2015-11-30 NOTE — ED Notes (Signed)
ED Provider at bedside. 

## 2015-11-30 NOTE — ED Notes (Signed)
Pt. Ambulated independently to restroom at this time.  

## 2016-02-12 ENCOUNTER — Emergency Department (HOSPITAL_COMMUNITY)
Admission: EM | Admit: 2016-02-12 | Discharge: 2016-02-12 | Disposition: A | Discharge status: Home / Self Care | Payer: BLUE CROSS/BLUE SHIELD | Attending: Emergency Medicine | Admitting: Emergency Medicine

## 2016-02-12 ENCOUNTER — Encounter (HOSPITAL_COMMUNITY): Payer: Self-pay | Admitting: Emergency Medicine

## 2016-02-12 DIAGNOSIS — J45909 Unspecified asthma, uncomplicated: Secondary | ICD-10-CM | POA: Insufficient documentation

## 2016-02-12 DIAGNOSIS — Z5321 Procedure and treatment not carried out due to patient leaving prior to being seen by health care provider: Secondary | ICD-10-CM | POA: Diagnosis not present

## 2016-02-12 MED ORDER — ALBUTEROL SULFATE (2.5 MG/3ML) 0.083% IN NEBU
5.0000 mg | INHALATION_SOLUTION | Freq: Once | RESPIRATORY_TRACT | Status: AC
  Administered 2016-02-12: 5 mg via RESPIRATORY_TRACT

## 2016-02-12 MED ORDER — ALBUTEROL SULFATE (2.5 MG/3ML) 0.083% IN NEBU
INHALATION_SOLUTION | RESPIRATORY_TRACT | Status: AC
  Filled 2016-02-12: qty 6

## 2016-02-12 NOTE — ED Notes (Signed)
Pt refused blood work  

## 2016-02-12 NOTE — ED Notes (Signed)
Has decided to leave. He does not want to wait. Pt is moved OTF.

## 2016-02-12 NOTE — ED Triage Notes (Signed)
Pt c/o asthma for few days able to manage with home medication, but run out of medication today. Denies any pain or discomfort.

## 2016-02-12 NOTE — ED Notes (Signed)
Pt refusing to have lab work done on triage, states he knows what he has and he doesn't need blood work or chest x ray.

## 2016-02-13 ENCOUNTER — Telehealth (HOSPITAL_COMMUNITY): Payer: Self-pay | Admitting: *Deleted

## 2016-02-13 ENCOUNTER — Ambulatory Visit (HOSPITAL_COMMUNITY)
Admission: EM | Admit: 2016-02-13 | Discharge: 2016-02-13 | Disposition: A | Discharge status: Home / Self Care | Payer: BLUE CROSS/BLUE SHIELD | Attending: Emergency Medicine | Admitting: Emergency Medicine

## 2016-02-13 ENCOUNTER — Encounter (HOSPITAL_COMMUNITY): Payer: Self-pay | Admitting: Emergency Medicine

## 2016-02-13 DIAGNOSIS — Z760 Encounter for issue of repeat prescription: Secondary | ICD-10-CM

## 2016-02-13 DIAGNOSIS — J4520 Mild intermittent asthma, uncomplicated: Secondary | ICD-10-CM | POA: Diagnosis not present

## 2016-02-13 MED ORDER — ALBUTEROL SULFATE HFA 108 (90 BASE) MCG/ACT IN AERS: 2.0000 | INHALATION_SPRAY | RESPIRATORY_TRACT | 3 refills | Status: DC | PRN

## 2016-02-13 MED ORDER — CETIRIZINE HCL 10 MG PO TABS: 10.0000 mg | ORAL_TABLET | Freq: Every day | ORAL | 2 refills | Status: DC

## 2016-02-13 MED ORDER — ALBUTEROL SULFATE (2.5 MG/3ML) 0.083% IN NEBU: 5.0000 mg | INHALATION_SOLUTION | RESPIRATORY_TRACT | 3 refills | Status: DC | PRN

## 2016-02-13 NOTE — Discharge Instructions (Signed)
I have refilled your inhaler and nebulizer. I also sent a prescription for a daily allergy pill. I do recommend establishing care with a primary care doctor just for annual checkups. From what you have described, I do not think you need a controller medicine for your asthma at this time.

## 2016-02-13 NOTE — ED Triage Notes (Signed)
[facility] a [facility] on his albuterol inhaler as well as nebulizer solution. The patient is attempting to gain a PCP.

## 2016-02-13 NOTE — ED Provider Notes (Signed)
[facility]    CSN: [id number] Arrival date & time: 02/13/16  1025     History   Chief Complaint Chief Complaint  Patient presents with  . Medication Refill    HPI Hamilton CapriJoshua Radman is a 31 y.o. male.   HPI He is a 31 year old man here for albuterol refill. He reports a history of intermittent asthma. He states this is typically triggered by allergies, particularly mold. He has been out of his inhaler and nebulizer for about 2-3 weeks. He is taking a daily allergy pill which helps control his symptoms. Over the last several days he has noticed increased wheezing, particularly at night. Denies any current wheezing or shortness of breath.  Past Medical History:  Diagnosis Date  . Asthma     There are no active problems to display for this patient.   History reviewed. No pertinent surgical history.     Home Medications    Prior to Admission medications   Medication Sig Start Date End Date Taking? Authorizing Provider  albuterol (PROVENTIL HFA;VENTOLIN HFA) 108 (90 Base) MCG/ACT inhaler Inhale 2 puffs into the lungs every 4 (four) hours as needed for wheezing or shortness of breath. 02/13/16   Charm RingsErin J Honig, MD  albuterol (PROVENTIL) (2.5 MG/3ML) 0.083% nebulizer solution Take 6 mLs (5 mg total) by nebulization every 4 (four) hours as needed for wheezing or shortness of breath. 02/13/16   Charm RingsErin J Honig, MD  cetirizine (ZYRTEC) 10 MG tablet Take 1 tablet (10 mg total) by mouth daily. 02/13/16   Charm RingsErin J Honig, MD    Family History History reviewed. No pertinent family history.  Social History Social History  Substance Use Topics  . Smoking status: Never Smoker  . Smokeless tobacco: Not on file  . Alcohol use Yes     Allergies   Amoxicillin and Penicillins   Review of Systems Review of Systems As in history of present illness  Physical Exam Triage Vital Signs ED Triage Vitals  Enc Vitals Group     BP 02/13/16 1038 119/79     Pulse Rate 02/13/16  1038 64     Resp 02/13/16 1038 16     Temp 02/13/16 1038 97.7 F (36.5 C)     Temp Source 02/13/16 1038 Oral     SpO2 02/13/16 1038 99 %     Weight --      Height --      Head Circumference --      Peak Flow --      Pain Score 02/13/16 1037 0     Pain Loc --      Pain Edu? --      Excl. in GC? --    No data found.   Updated Vital Signs BP 119/79 (BP Location: Right Arm)   Pulse 64   Temp 97.7 F (36.5 C) (Oral)   Resp 16   SpO2 99%   Visual Acuity Right Eye Distance:   Left Eye Distance:   Bilateral Distance:    Right Eye Near:   Left Eye Near:    Bilateral Near:     Physical Exam  Constitutional: He is oriented to person, place, and time. He appears well-developed and well-nourished. No distress.  Neck: Neck supple.  Cardiovascular: Normal rate, regular rhythm and normal heart sounds.   No murmur heard. Pulmonary/Chest: Effort normal and breath sounds normal. No respiratory distress. He has no wheezes. He has no rales.  Neurological: He is alert and oriented to person, place,  and time.     UC Treatments / Results  Labs (all labs ordered are listed, but only abnormal results are displayed) Labs Reviewed - No data to display  EKG  EKG Interpretation None       Radiology No results found.  Procedures Procedures (including critical care time)  Medications Ordered in UC Medications - No data to display   Initial Impression / Assessment and Plan / UC Course  I have reviewed the triage vital signs and the nursing notes.  Pertinent labs & imaging results that were available during my care of the patient were reviewed by me and considered in my medical decision making (see chart for details).     Prescription sent for cetirizine, albuterol inhaler, and albuterol nebulizer. Recommended establishing with a PCP for future management.  Final Clinical Impressions(s) / UC Diagnoses   Final diagnoses:  Medication refill  Mild intermittent asthma  without complication    New Prescriptions New Prescriptions   CETIRIZINE (ZYRTEC) 10 MG TABLET    Take 1 tablet (10 mg total) by mouth daily.     Charm Rings, MD 02/13/16 [phone]

## 2016-02-17 IMAGING — CR DG CHEST 2V
2 series · 2 of 2 positions shown · non-contrast
Comparison: None.

CLINICAL DATA: Shortness of breath.  Asthma.

EXAM:
CHEST  2 VIEW

[chest pa]
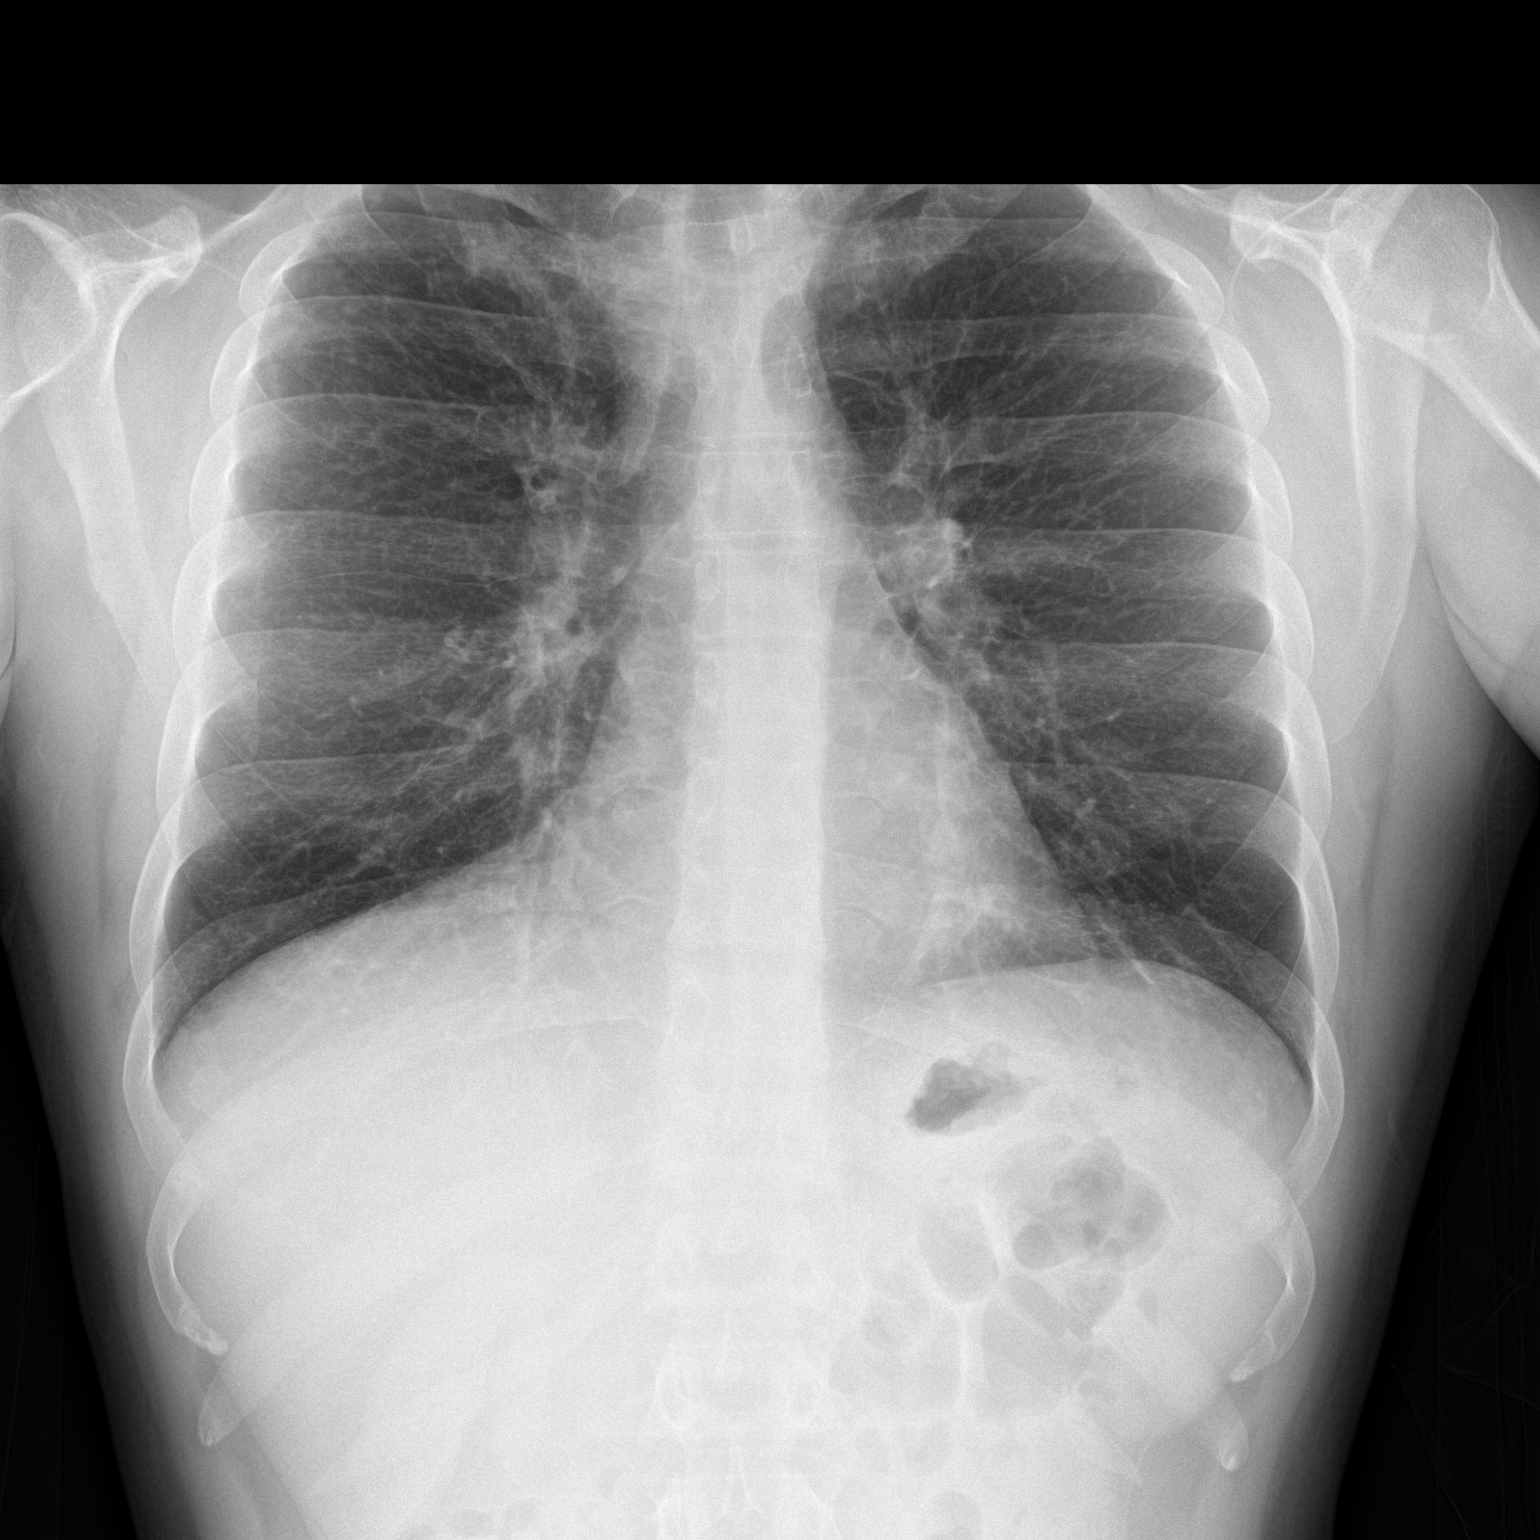

[chest lat]
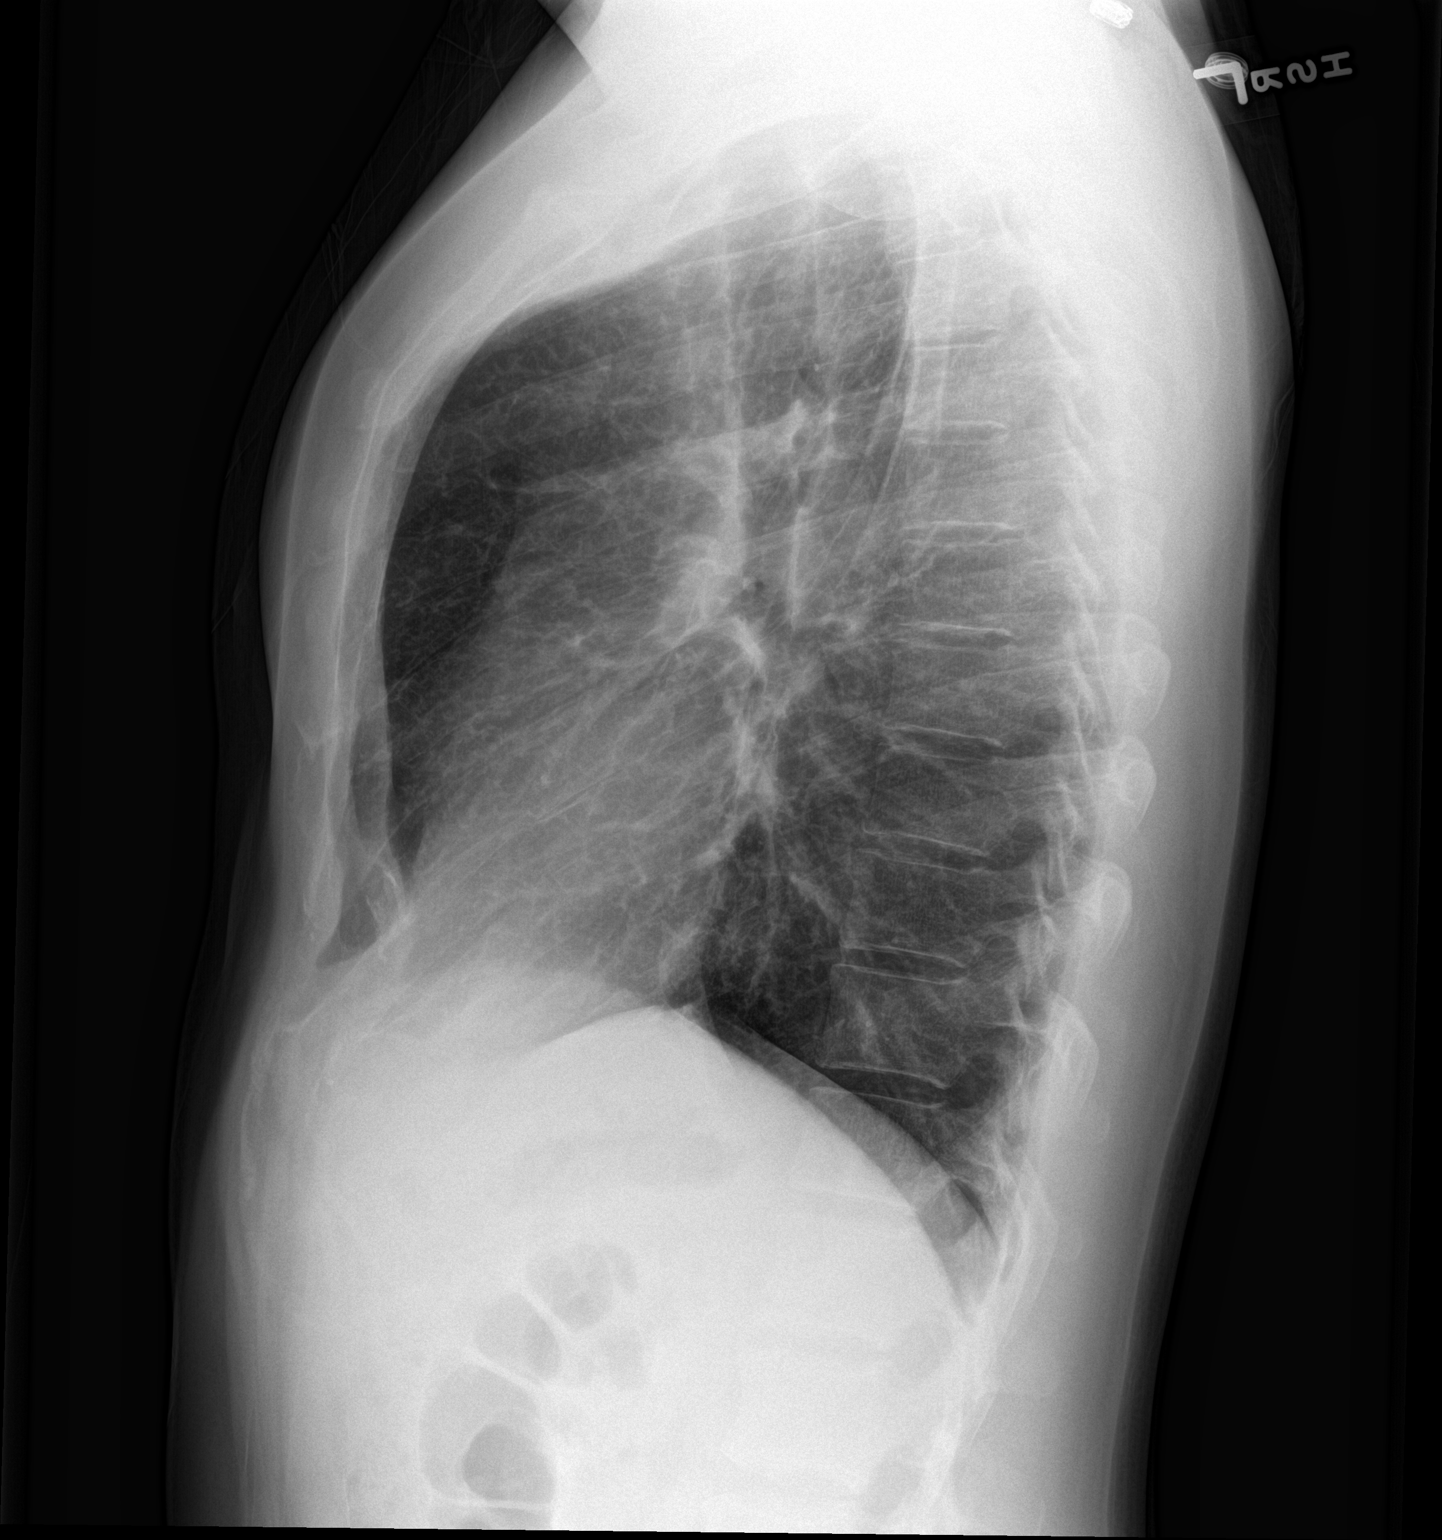

[2 of 2 positions shown; findings below may reference images not displayed]

FINDINGS: Heart size and pulmonary vascularity are normal. Slight accentuation
of the interstitial markings. Increased AP dimension of the chest.
No consolidative infiltrates or effusions. Heart size and
vascularity are normal. No osseous abnormality.
IMPRESSION: No acute infiltrates. Hyperinflation and accentuated interstitial
markings consistent with asthma.

## 2016-05-28 ENCOUNTER — Encounter (HOSPITAL_COMMUNITY): Payer: Self-pay | Admitting: Emergency Medicine

## 2016-05-28 ENCOUNTER — Ambulatory Visit (HOSPITAL_COMMUNITY)
Admission: EM | Admit: 2016-05-28 | Discharge: 2016-05-28 | Disposition: A | Discharge status: Home / Self Care | Payer: BLUE CROSS/BLUE SHIELD | Attending: Internal Medicine | Admitting: Internal Medicine

## 2016-05-28 DIAGNOSIS — J45909 Unspecified asthma, uncomplicated: Secondary | ICD-10-CM | POA: Diagnosis not present

## 2016-05-28 MED ORDER — CETIRIZINE HCL 10 MG PO TABS: 10.0000 mg | ORAL_TABLET | Freq: Every day | ORAL | 2 refills | Status: DC

## 2016-05-28 MED ORDER — ALBUTEROL SULFATE HFA 108 (90 BASE) MCG/ACT IN AERS: 2.0000 | INHALATION_SPRAY | RESPIRATORY_TRACT | 3 refills | Status: DC | PRN

## 2016-05-28 MED ORDER — ALBUTEROL SULFATE (2.5 MG/3ML) 0.083% IN NEBU: 5.0000 mg | INHALATION_SOLUTION | RESPIRATORY_TRACT | 3 refills | Status: DC | PRN

## 2016-05-28 NOTE — ED Triage Notes (Signed)
Requesting albuterol inhaler and nebulizer solution.  Allergy medicine.    Patient's asthma is acting up .  Chest tightness, congestion.

## 2016-05-28 NOTE — ED Provider Notes (Signed)
CSN: [id number]     Arrival date & time 05/28/16  1256 History   None    Chief Complaint  Patient presents with  . Medication Refill   (Consider location/radiation/quality/duration/timing/severity/associated sxs/prior Treatment) Pt complains of needing asthma medications   The history is provided by the patient. No language interpreter was used.  Medication Refill  Medications/supplies requested:  Albuterol Reason for request:  Clinic/provider not available Patient has complete original prescription information: no   Pt complains of being out of albuterol inhaler and neb solution. Pt reports he also needs an rx for zyrtec.   Pt is interested in other asthma treatments.  He does not have a primary.  He has never seen an asthma specialist  Past Medical History:  Diagnosis Date  . Asthma    History reviewed. No pertinent surgical history. No family history on file. Social History  Substance Use Topics  . Smoking status: Never Smoker  . Smokeless tobacco: Not on file  . Alcohol use Yes    Review of Systems  All other systems reviewed and are negative.   Allergies  Amoxicillin and Penicillins  Home Medications   Prior to Admission medications   Medication Sig Start Date End Date Taking? Authorizing Provider  albuterol (PROVENTIL HFA;VENTOLIN HFA) 108 (90 Base) MCG/ACT inhaler Inhale 2 puffs into the lungs every 4 (four) hours as needed for wheezing or shortness of breath. 05/28/16   Elson AreasSofia, Leslie K, PA-C  albuterol (PROVENTIL) (2.5 MG/3ML) 0.083% nebulizer solution Take 6 mLs (5 mg total) by nebulization every 4 (four) hours as needed for wheezing or shortness of breath. 05/28/16   Elson AreasSofia, Leslie K, PA-C  cetirizine (ZYRTEC) 10 MG tablet Take 1 tablet (10 mg total) by mouth daily. 05/28/16   Elson AreasSofia, Leslie K, PA-C   Meds Ordered and Administered this Visit  Medications - No data to display  BP 110/65 (BP Location: Right Arm)   Pulse 69   Temp 97.8 F (36.6 C) (Oral)   Resp  16   SpO2 96%  No data found.   Physical Exam  Constitutional: He appears well-developed and well-nourished.  HENT:  Head: Normocephalic.  Eyes: Pupils are equal, round, and reactive to light.  Neck: Normal range of motion.  Cardiovascular: Normal rate and regular rhythm.   Pulmonary/Chest: He has no wheezes.  Musculoskeletal: Normal range of motion.  Neurological: He is alert.  Skin: Skin is warm.  Psychiatric: He has a normal mood and affect.  Nursing note and vitals reviewed.   Urgent Care Course     Procedures (including critical care time)  Labs Review Labs Reviewed - No data to display  Imaging Review No results found.   Visual Acuity Review  Right Eye Distance:   Left Eye Distance:   Bilateral Distance:    Right Eye Near:   Left Eye Near:    Bilateral Near:         MDM   1. Moderate asthma without complication, unspecified whether persistent    Schedule appointment with [facility] for evaluation An After Visit Summary was printed and given to the patient. Meds ordered this encounter  Medications  . albuterol (PROVENTIL) (2.5 MG/3ML) 0.083% nebulizer solution    Sig: Take 6 mLs (5 mg total) by nebulization every 4 (four) hours as needed for wheezing or shortness of breath.    Dispense:  75 mL    Refill:  3    Order Specific Question:   Supervising Provider    Answer:  Eustace Moore [[id number]]  . albuterol (PROVENTIL HFA;VENTOLIN HFA) 108 (90 Base) MCG/ACT inhaler    Sig: Inhale 2 puffs into the lungs every 4 (four) hours as needed for wheezing or shortness of breath.    Dispense:  1 Inhaler    Refill:  3    Order Specific Question:   Supervising Provider    Answer:   Eustace Moore [[id number]]  . cetirizine (ZYRTEC) 10 MG tablet    Sig: Take 1 tablet (10 mg total) by mouth daily.    Dispense:  30 tablet    Refill:  2    Order Specific Question:   Supervising Provider    Answer:   Eustace Moore [[id number]]      Elson Areas,  PA-C 05/28/16 1807

## 2016-07-25 ENCOUNTER — Encounter (HOSPITAL_COMMUNITY): Payer: Self-pay | Admitting: Emergency Medicine

## 2016-07-25 ENCOUNTER — Ambulatory Visit (HOSPITAL_COMMUNITY)
Admission: EM | Admit: 2016-07-25 | Discharge: 2016-07-25 | Disposition: A | Discharge status: Home / Self Care | Payer: BLUE CROSS/BLUE SHIELD | Attending: Internal Medicine | Admitting: Internal Medicine

## 2016-07-25 DIAGNOSIS — Z760 Encounter for issue of repeat prescription: Secondary | ICD-10-CM | POA: Diagnosis not present

## 2016-07-25 DIAGNOSIS — J4540 Moderate persistent asthma, uncomplicated: Secondary | ICD-10-CM | POA: Diagnosis not present

## 2016-07-25 MED ORDER — ALBUTEROL SULFATE HFA 108 (90 BASE) MCG/ACT IN AERS: 2.0000 | INHALATION_SPRAY | RESPIRATORY_TRACT | 0 refills | Status: DC | PRN

## 2016-07-25 NOTE — Discharge Instructions (Signed)
Wants you are established with a primary care provider and medications are added to help prevent asthma attacks and severity of asthma you will be breathing better, you will not have to have so many inhalations of albuterol per day or week and you will not have his many visits to the doctor. You can get refills on her medications often over the phone.

## 2016-07-25 NOTE — ED Provider Notes (Signed)
CSN: [id number]     Arrival date & time 07/25/16  1535 History   First MD Initiated Contact with Patient 07/25/16 1653     Chief Complaint  Patient presents with  . Medication Refill   (Consider location/radiation/quality/duration/timing/severity/associated sxs/prior Treatment) 31 year old male with history of asthma 3 presents to the urgent care for refill of his Proventil. He has been here several times in the emergency department for his refill. He has not followed up with any of the referrals that he has been given. He states he does not rate his instructions. He is requesting Proventil with refills.      Past Medical History:  Diagnosis Date  . Asthma    History reviewed. No pertinent surgical history. History reviewed. No pertinent family history. Social History  Substance Use Topics  . Smoking status: Never Smoker  . Smokeless tobacco: Not on file  . Alcohol use Yes    Review of Systems  Constitutional: Negative.   HENT: Negative.   Respiratory: Positive for wheezing. Negative for shortness of breath.   Gastrointestinal: Negative.   Musculoskeletal: Negative.   All other systems reviewed and are negative.   Allergies  Amoxicillin and Penicillins  Home Medications   Prior to Admission medications   Medication Sig Start Date End Date Taking? Authorizing Provider  albuterol (PROVENTIL HFA;VENTOLIN HFA) 108 (90 Base) MCG/ACT inhaler Inhale 2 puffs into the lungs every 4 (four) hours as needed for wheezing or shortness of breath. Proventil as long as paid for by ins. 07/25/16   Hayden RasmussenMabe, Murle Otting, NP   Meds Ordered and Administered this Visit  Medications - No data to display  BP 103/65 (BP Location: Right Arm)   Pulse 88   Temp 98.5 F (36.9 C) (Oral)   Resp 18   SpO2 99%  No data found.   Physical Exam  Constitutional: He is oriented to person, place, and time. He appears well-developed and well-nourished. No distress.  Eyes: EOM are normal.  Neck: Neck  supple.  Pulmonary/Chest:  While speaking he has some increase effort in breathing. Positive for expiratory wheezes.  Neurological: He is alert and oriented to person, place, and time.  Skin: Skin is warm.  Psychiatric: He has a normal mood and affect.  Nursing note and vitals reviewed.   Urgent Care Course     Procedures (including critical care time)  Labs Review Labs Reviewed - No data to display  Imaging Review No results found.   Visual Acuity Review  Right Eye Distance:   Left Eye Distance:   Bilateral Distance:    Right Eye Near:   Left Eye Near:    Bilateral Near:         MDM   1. Encounter for medication refill   2. Moderate persistent asthma without complication    Wants you are established with a primary care provider and medications are added to help prevent asthma attacks and severity of asthma you will be breathing better, you will not have to have so many inhalations of albuterol per day or week and you will not have his many visits to the doctor. You can get refills on her medications often over the phone. Meds ordered this encounter  Medications  . albuterol (PROVENTIL HFA;VENTOLIN HFA) 108 (90 Base) MCG/ACT inhaler    Sig: Inhale 2 puffs into the lungs every 4 (four) hours as needed for wheezing or shortness of breath. Proventil as long as paid for by ins.    Dispense:  1 Inhaler  Refill:  0    Order Specific Question:   Supervising Provider    Answer:   Eustace Moore [[id number]]  Hayden Rasmussen, NP 07/25/16 1729

## 2016-07-25 NOTE — ED Triage Notes (Signed)
[facility] a [facility] his Ventolin ref[facility].

## 2016-12-12 ENCOUNTER — Other Ambulatory Visit: Payer: Self-pay

## 2016-12-12 ENCOUNTER — Encounter (HOSPITAL_COMMUNITY): Payer: Self-pay | Admitting: Emergency Medicine

## 2016-12-12 ENCOUNTER — Ambulatory Visit (HOSPITAL_COMMUNITY)
Admission: EM | Admit: 2016-12-12 | Discharge: 2016-12-12 | Disposition: A | Discharge status: Home / Self Care | Payer: BLUE CROSS/BLUE SHIELD | Attending: Family Medicine | Admitting: Family Medicine

## 2016-12-12 DIAGNOSIS — Z760 Encounter for issue of repeat prescription: Secondary | ICD-10-CM

## 2016-12-12 DIAGNOSIS — J4520 Mild intermittent asthma, uncomplicated: Secondary | ICD-10-CM | POA: Diagnosis not present

## 2016-12-12 MED ORDER — ALBUTEROL SULFATE HFA 108 (90 BASE) MCG/ACT IN AERS: 1.0000 | INHALATION_SPRAY | Freq: Four times a day (QID) | RESPIRATORY_TRACT | 0 refills | Status: AC | PRN

## 2016-12-12 NOTE — Discharge Instructions (Signed)
Please establish with a primary care provider for further evaluation and management of your chronic asthma

## 2016-12-12 NOTE — ED Triage Notes (Signed)
Per pt he is here for refills for his Proventil Inhaler and Albuterol solution for his nebulizer.

## 2016-12-12 NOTE — ED Provider Notes (Signed)
[facility]    CSN: [id number] Arrival date & time: 12/12/16  1125     History   Chief Complaint Chief Complaint  Patient presents with  . Medication Refill    HPI Lawrence Gutierrez is a 31 y.o. male.   Ivin BootyJoshua presents with request for refill of his nebulizer or inhaler as he no longer has either medication for his asthma symptoms. He states he has been out of his inhaler for some time, but has been using his nebulizer approximately once a day. He states he has not been able to find the time to establish with a primary care provider, but he is aware that he needs to do this. Denies exacerbation of asthma, fever, or shortness of breath. Denies pain. He was on a maintenance inhaler but has not had this for a long time and does not know what it was.     ROS per HPI.       Past Medical History:  Diagnosis Date  . Asthma     There are no active problems to display for this patient.   History reviewed. No pertinent surgical history.     Home Medications    Prior to Admission medications   Medication Sig Start Date End Date Taking? Authorizing Provider  albuterol (PROVENTIL HFA;VENTOLIN HFA) 108 (90 Base) MCG/ACT inhaler Inhale 1-2 puffs into the lungs every 6 (six) hours as needed for wheezing or shortness of breath. 12/12/16   Georgetta HaberBurky, Natalie B, NP    Family History History reviewed. No pertinent family history.  Social History Social History   Tobacco Use  . Smoking status: Never Smoker  Substance Use Topics  . Alcohol use: Yes  . Drug use: No     Allergies   Amoxicillin and Penicillins   Review of Systems Review of Systems   Physical Exam Triage Vital Signs ED Triage Vitals  Enc Vitals Group     BP 12/12/16 1157 (!) 124/92     Pulse Rate 12/12/16 1157 78     Resp --      Temp 12/12/16 1157 97.8 F (36.6 C)     Temp Source 12/12/16 1157 Oral     SpO2 12/12/16 1157 97 %     Weight --      Height --      Head Circumference --       Peak Flow --      Pain Score 12/12/16 1155 0     Pain Loc --      Pain Edu? --      Excl. in GC? --    No data found.  Updated Vital Signs BP (!) 124/92 (BP Location: Left Arm)   Pulse 78   Temp 97.8 F (36.6 C) (Oral)   SpO2 97%   Visual Acuity Right Eye Distance:   Left Eye Distance:   Bilateral Distance:    Right Eye Near:   Left Eye Near:    Bilateral Near:     Physical Exam  Constitutional: He is oriented to person, place, and time. He appears well-developed and well-nourished.  Cardiovascular: Normal rate and regular rhythm.  Pulmonary/Chest: Effort normal and breath sounds normal. He has no wheezes.  No active wheezes at this time  Neurological: He is alert and oriented to person, place, and time.  Skin: Skin is warm and dry.     UC Treatments / Results  Labs (all labs ordered are listed, but only abnormal results are displayed) Labs Reviewed -  No data to display  EKG  EKG Interpretation None       Radiology No results found.  Procedures Procedures (including critical care time)  Medications Ordered in UC Medications - No data to display   Initial Impression / Assessment and Plan / UC Course  I have reviewed the triage vital signs and the nursing notes.  Pertinent labs & imaging results that were available during my care of the patient were reviewed by me and considered in my medical decision making (see chart for details).     Rescue inhaler refilled today. Encouraged establishment with a PCP as pulmonary function tests may be appropriate for him, as well to have regular management of his symptoms. Patient verbalized understanding and agreeable to plan.    Final Clinical Impressions(s) / UC Diagnoses   Final diagnoses:  Encounter for medication refill  Mild intermittent asthma, unspecified whether complicated    ED Discharge Orders        Ordered    albuterol (PROVENTIL HFA;VENTOLIN HFA) 108 (90 Base) MCG/ACT inhaler  Every 6  hours PRN     12/12/16 1206       Controlled Substance Prescriptions [place] Controlled Substance Registry consulted? Not Applicable   Georgetta HaberBurky, Natalie B, NP 12/12/16 1212

## 2017-06-25 ENCOUNTER — Encounter (HOSPITAL_COMMUNITY): Payer: Self-pay | Admitting: Emergency Medicine

## 2017-06-25 ENCOUNTER — Emergency Department (HOSPITAL_COMMUNITY)
Admission: EM | Admit: 2017-06-25 | Discharge: 2017-06-25 | Disposition: A | Discharge status: Home / Self Care | Payer: BLUE CROSS/BLUE SHIELD | Attending: Emergency Medicine | Admitting: Emergency Medicine

## 2017-06-25 DIAGNOSIS — Z79899 Other long term (current) drug therapy: Secondary | ICD-10-CM | POA: Insufficient documentation

## 2017-06-25 DIAGNOSIS — J45901 Unspecified asthma with (acute) exacerbation: Secondary | ICD-10-CM | POA: Insufficient documentation

## 2017-06-25 MED ORDER — ALBUTEROL SULFATE (2.5 MG/3ML) 0.083% IN NEBU: 2.5000 mg | INHALATION_SOLUTION | Freq: Four times a day (QID) | RESPIRATORY_TRACT | 12 refills | Status: AC | PRN

## 2017-06-25 MED ORDER — ALBUTEROL SULFATE HFA 108 (90 BASE) MCG/ACT IN AERS
2.0000 | INHALATION_SPRAY | RESPIRATORY_TRACT | Status: DC | PRN
  Administered 2017-06-25: 2 via RESPIRATORY_TRACT
  Filled 2017-06-25: qty 6.7

## 2017-06-25 MED ORDER — IPRATROPIUM-ALBUTEROL 0.5-2.5 (3) MG/3ML IN SOLN
3.0000 mL | Freq: Once | RESPIRATORY_TRACT | Status: AC
  Administered 2017-06-25: 3 mL via RESPIRATORY_TRACT
  Filled 2017-06-25: qty 3

## 2017-06-25 NOTE — ED Triage Notes (Signed)
Pt reports he ran out of his asthma inhaler and isnt able to see his PCP until Friday. Pt is requesting prescription for another inhaler and albuterol solution.

## 2017-06-25 NOTE — ED Provider Notes (Signed)
[facility] Note   CSN: [id number] Arrival date & time: 06/25/17  0030     History   Chief Complaint Chief Complaint  Patient presents with  . Asthma    HPI Lawrence Gutierrez is a 32 y.o. male.  Patient presents to the emergency department with a chief complaint of chest tightness.  He states that he has asthma.  States that he has been out of his nebulizer solution.  He has been using an inhaler with some relief.  He denies any productive cough or fever.  He states that he has an appointment with his doctor on Friday to have his medications refilled.  He is asking for breathing treatment now and would like to see if he can get a refill of his nebulizer solution tonight.  He denies any other associated symptoms.  He states that his symptoms are exacerbated by the weather changes.  The history is provided by the patient. No language interpreter was used.    Past Medical History:  Diagnosis Date  . Asthma     There are no active problems to display for this patient.   History reviewed. No pertinent surgical history.      Home Medications    Prior to Admission medications   Medication Sig Start Date End Date Taking? Authorizing Provider  albuterol (PROVENTIL HFA;VENTOLIN HFA) 108 (90 Base) MCG/ACT inhaler Inhale 1-2 puffs into the lungs every 6 (six) hours as needed for wheezing or shortness of breath. 12/12/16   Georgetta HaberBurky, Natalie B, NP    Family History No family history on file.  Social History Social History   Tobacco Use  . Smoking status: Never Smoker  . Smokeless tobacco: Never Used  Substance Use Topics  . Alcohol use: Yes    Comment: Socially  . Drug use: No     Allergies   Amoxicillin and Penicillins   Review of Systems Review of Systems  All other systems reviewed and are negative.    Physical Exam Updated Vital Signs BP (!) 123/94 (BP Location: Right Arm)   Pulse (!) 102   Temp 97.7 F  (36.5 C) (Oral)   Resp 16   Ht 5\' 7"  (1.702 m)   Wt 77.1 kg (170 lb)   SpO2 96%   BMI 26.63 kg/m   Physical Exam  Constitutional: He is oriented to person, place, and time. He appears well-developed and well-nourished.  HENT:  Head: Normocephalic and atraumatic.  Eyes: Pupils are equal, round, and reactive to light. Conjunctivae and EOM are normal. Right eye exhibits no discharge. Left eye exhibits no discharge. No scleral icterus.  Neck: Normal range of motion. Neck supple. No JVD present.  Cardiovascular: Normal rate, regular rhythm and normal heart sounds. Exam reveals no gallop and no friction rub.  No murmur heard. Pulmonary/Chest: Effort normal. No respiratory distress. He has no wheezes. He has no rales. He exhibits no tenderness.  Diminished lung sounds  Abdominal: Soft. He exhibits no distension and no mass. There is no tenderness. There is no rebound and no guarding.  Musculoskeletal: Normal range of motion. He exhibits no edema or tenderness.  Neurological: He is alert and oriented to person, place, and time.  Skin: Skin is warm and dry.  Psychiatric: He has a normal mood and affect. His behavior is normal. Judgment and thought content normal.  Nursing note and vitals reviewed.    ED Treatments / Results  Labs (all labs ordered are listed, but only abnormal  results are displayed) Labs Reviewed - No data to display  EKG None  Radiology No results found.  Procedures Procedures (including critical care time)  Medications Ordered in ED Medications  ipratropium-albuterol (DUONEB) 0.5-2.5 (3) MG/3ML nebulizer solution 3 mL (has no administration in time range)  albuterol (PROVENTIL HFA;VENTOLIN HFA) 108 (90 Base) MCG/ACT inhaler 2 puff (has no administration in time range)     Initial Impression / Assessment and Plan / ED Course  I have reviewed the triage vital signs and the nursing notes.  Pertinent labs & imaging results that were available during my care  of the patient were reviewed by me and considered in my medical decision making (see chart for details).     Patient here with mild asthma exacerbation.  Will give nebulizer treatment in the ED.  Will discharge home with inhaler and nebulizer solution.  Return precautions given.  VSS.  Final Clinical Impressions(s) / ED Diagnoses   Final diagnoses:  Exacerbation of asthma, unspecified asthma severity, unspecified whether persistent    ED Discharge Orders    None       Roxy Horseman, PA-C 06/25/17 0236    Glynn Octave, MD 06/25/17 [phone]
